# Patient Record
Sex: Female | Born: 1965 | Race: White | Hispanic: No | Marital: Married | Smoking: Former smoker
Health system: Southern US, Community
[De-identification: ages and names within clinical notes are randomized; demographics above are authoritative.]

## PROBLEM LIST (undated history)

## (undated) DIAGNOSIS — I10 Essential (primary) hypertension: Secondary | ICD-10-CM

## (undated) DIAGNOSIS — E079 Disorder of thyroid, unspecified: Secondary | ICD-10-CM

## (undated) HISTORY — PX: HUMERUS FRACTURE SURGERY: SHX670

---

## 2017-04-14 ENCOUNTER — Emergency Department (HOSPITAL_COMMUNITY)
Admission: EM | Admit: 2017-04-14 | Discharge: 2017-04-14 | Disposition: A | Discharge status: Home / Self Care | Payer: Self-pay | Attending: Emergency Medicine | Admitting: Emergency Medicine

## 2017-04-14 ENCOUNTER — Encounter (HOSPITAL_COMMUNITY): Payer: Self-pay | Admitting: *Deleted

## 2017-04-14 ENCOUNTER — Other Ambulatory Visit: Payer: Self-pay

## 2017-04-14 DIAGNOSIS — R51 Headache: Secondary | ICD-10-CM | POA: Insufficient documentation

## 2017-04-14 DIAGNOSIS — Z880 Allergy status to penicillin: Secondary | ICD-10-CM | POA: Insufficient documentation

## 2017-04-14 DIAGNOSIS — I10 Essential (primary) hypertension: Secondary | ICD-10-CM | POA: Insufficient documentation

## 2017-04-14 DIAGNOSIS — F17200 Nicotine dependence, unspecified, uncomplicated: Secondary | ICD-10-CM | POA: Insufficient documentation

## 2017-04-14 HISTORY — DX: Essential (primary) hypertension: I10

## 2017-04-14 HISTORY — DX: Disorder of thyroid, unspecified: E07.9

## 2017-04-14 LAB — I-STAT CHEM 8, ED
BUN: 12 mg/dL (ref 6–20)
CHLORIDE: 100 mmol/L — AB (ref 101–111)
Calcium, Ion: 1.19 mmol/L (ref 1.15–1.40)
Creatinine, Ser: 0.7 mg/dL (ref 0.44–1.00)
Glucose, Bld: 97 mg/dL (ref 65–99)
HEMATOCRIT: 47 % — AB (ref 36.0–46.0)
Hemoglobin: 16 g/dL — ABNORMAL HIGH (ref 12.0–15.0)
Potassium: 4.1 mmol/L (ref 3.5–5.1)
SODIUM: 138 mmol/L (ref 135–145)
TCO2: 27 mmol/L (ref 22–32)

## 2017-04-14 MED ORDER — AMLODIPINE BESYLATE 5 MG PO TABS
10.0000 mg | ORAL_TABLET | Freq: Once | ORAL | Status: AC
  Administered 2017-04-14: 10 mg via ORAL
  Filled 2017-04-14: qty 2

## 2017-04-14 MED ORDER — AMLODIPINE BESYLATE 5 MG PO TABS: 5.0000 mg | ORAL_TABLET | Freq: Every day | ORAL | 0 refills | Status: DC

## 2017-04-14 MED ORDER — ACETAMINOPHEN 500 MG PO TABS: 1000.0000 mg | ORAL_TABLET | Freq: Once | ORAL | Status: DC

## 2017-04-14 NOTE — ED Provider Notes (Signed)
[facility] EMERGENCY DEPARTMENT Provider Note   CSN: [id number] Arrival date & time: 04/14/17  1324     History   Chief Complaint Chief Complaint  Patient presents with  . Hypertension    HPI Melanie Johnson is a 52 y.o. female.  Patient with hx htn, presents w concern high bp. Had been compliant w meds w exception of not taking hctz for past few days. Has adequate of her meds at home. Had gradual onset of a slight headache earlier. No severe headaches. No change in vision or speech. No numbness/weakness. No chest pain or sob. No nv. States otherwise feels well, at baseline.      The history is provided by the patient.  Hypertension  Pertinent negatives include no chest pain, no abdominal pain and no shortness of breath.    Past Medical History:  Diagnosis Date  . Hypertension   . Thyroid disease     There are no active problems to display for this patient.   History reviewed. No pertinent surgical history.  OB History    No data available       Home Medications    Prior to Admission medications   Not on File    Family History History reviewed. No pertinent family history.  Social History Social History   Tobacco Use  . Smoking status: Current Every Day Smoker  Substance Use Topics  . Alcohol use: No    Frequency: Never  . Drug use: No     Allergies   Penicillins   Review of Systems Review of Systems  Constitutional: Negative for fever.  HENT: Negative for sore throat.   Eyes: Negative for visual disturbance.  Respiratory: Negative for shortness of breath.   Cardiovascular: Negative for chest pain.  Gastrointestinal: Negative for abdominal pain.  Genitourinary: Negative for flank pain.  Musculoskeletal: Negative for neck pain.  Skin: Negative for rash.  Neurological: Negative for weakness and numbness.  Psychiatric/Behavioral: Negative for confusion.     Physical Exam Updated Vital Signs BP (!) 219/119 (BP  Location: Right Arm)   Pulse 63   Temp 98.7 F (37.1 C) (Oral)   Resp 18   SpO2 100%   Physical Exam  Constitutional: She appears well-developed and well-nourished. No distress.  HENT:  Head: Atraumatic.  Eyes: Conjunctivae are normal. No scleral icterus.  Neck: Neck supple. No tracheal deviation present. No thyromegaly present.  Cardiovascular: Normal rate, regular rhythm, normal heart sounds and intact distal pulses.  No murmur heard. Pulmonary/Chest: Effort normal and breath sounds normal. No respiratory distress.  Abdominal: Normal appearance. She exhibits no distension.  Musculoskeletal: She exhibits no edema.  Neurological: She is alert.  Speech normal. Steady gait.   Skin: Skin is warm and dry. No rash noted. She is not diaphoretic.  Psychiatric: She has a normal mood and affect.  Nursing note and vitals reviewed.    ED Treatments / Results  Labs (all labs ordered are listed, but only abnormal results are displayed) Results for orders placed or performed during the hospital encounter of 04/14/17  I-stat chem 8, ed  Result Value Ref Range   Sodium 138 135 - 145 mmol/L   Potassium 4.1 3.5 - 5.1 mmol/L   Chloride 100 (L) 101 - 111 mmol/L   BUN 12 6 - 20 mg/dL   Creatinine, Ser 4.01 0.44 - 1.00 mg/dL   Glucose, Bld 97 65 - 99 mg/dL   Calcium, Ion 0.27 2.53 - 1.40 mmol/L   TCO2 27  22 - 32 mmol/L   Hemoglobin 16.0 (H) 12.0 - 15.0 g/dL   HCT 16.147.0 (H) 09.636.0 - 04.546.0 %    EKG  EKG Interpretation None       Radiology No results found.  Procedures Procedures (including critical care time)  Medications Ordered in ED Medications  amLODipine (NORVASC) tablet 10 mg (not administered)     Initial Impression / Assessment and Plan / ED Course  I have reviewed the triage vital signs and the nursing notes.  Pertinent labs & imaging results that were available during my care of the patient were reviewed by me and considered in my medical decision making (see chart for  details).  bp high.   istat 8 sent.   Amlodipine po.  Acetaminophen po.  bp improved.   Labs reviewed - normal.   Final Clinical Impressions(s) / ED Diagnoses   Final diagnoses:  None    ED Discharge Orders    None       Cathren LaineSteinl, William Schake, MD 04/14/17 1010

## 2017-04-14 NOTE — Discharge Instructions (Signed)
It was our pleasure to provide your ER care today - we hope that you feel better.  Take amlodipine as prescribed, in addition to your other blood pressure medication.  Follow up with your doctor for recheck in the coming week.  Return to ER if worse, new symptoms, other concern.

## 2017-04-14 NOTE — ED Triage Notes (Signed)
Pt reports hx of HTN,having headache for several days and SBP > 200. Only complaint is headache. No acute distress is noted at triage. BP 219/119.

## 2018-01-10 ENCOUNTER — Inpatient Hospital Stay (HOSPITAL_COMMUNITY)
Admission: RE | Admit: 2018-01-10 | Discharge: 2018-01-13 | DRG: 247 | Disposition: A | Discharge status: Home / Self Care | Payer: Medicaid Other | Source: Other Acute Inpatient Hospital | Attending: Cardiovascular Disease | Admitting: Cardiovascular Disease

## 2018-01-10 ENCOUNTER — Encounter (HOSPITAL_COMMUNITY)
Admission: RE | Disposition: A | Discharge status: Home / Self Care | Payer: Self-pay | Source: Other Acute Inpatient Hospital | Attending: Cardiovascular Disease

## 2018-01-10 DIAGNOSIS — I2510 Atherosclerotic heart disease of native coronary artery without angina pectoris: Secondary | ICD-10-CM | POA: Diagnosis not present

## 2018-01-10 DIAGNOSIS — I255 Ischemic cardiomyopathy: Secondary | ICD-10-CM | POA: Diagnosis present

## 2018-01-10 DIAGNOSIS — I213 ST elevation (STEMI) myocardial infarction of unspecified site: Secondary | ICD-10-CM

## 2018-01-10 DIAGNOSIS — I2102 ST elevation (STEMI) myocardial infarction involving left anterior descending coronary artery: Secondary | ICD-10-CM | POA: Diagnosis present

## 2018-01-10 DIAGNOSIS — E876 Hypokalemia: Secondary | ICD-10-CM | POA: Diagnosis not present

## 2018-01-10 DIAGNOSIS — Z880 Allergy status to penicillin: Secondary | ICD-10-CM

## 2018-01-10 DIAGNOSIS — R0600 Dyspnea, unspecified: Secondary | ICD-10-CM

## 2018-01-10 DIAGNOSIS — I10 Essential (primary) hypertension: Secondary | ICD-10-CM | POA: Diagnosis present

## 2018-01-10 DIAGNOSIS — E785 Hyperlipidemia, unspecified: Secondary | ICD-10-CM | POA: Diagnosis present

## 2018-01-10 DIAGNOSIS — Z8249 Family history of ischemic heart disease and other diseases of the circulatory system: Secondary | ICD-10-CM

## 2018-01-10 DIAGNOSIS — I2109 ST elevation (STEMI) myocardial infarction involving other coronary artery of anterior wall: Secondary | ICD-10-CM

## 2018-01-10 DIAGNOSIS — F17210 Nicotine dependence, cigarettes, uncomplicated: Secondary | ICD-10-CM | POA: Diagnosis present

## 2018-01-10 DIAGNOSIS — Z955 Presence of coronary angioplasty implant and graft: Secondary | ICD-10-CM

## 2018-01-10 HISTORY — PX: CORONARY/GRAFT ACUTE MI REVASCULARIZATION: CATH118305

## 2018-01-10 HISTORY — PX: LEFT HEART CATH AND CORONARY ANGIOGRAPHY: CATH118249

## 2018-01-10 HISTORY — DX: ST elevation (STEMI) myocardial infarction involving other coronary artery of anterior wall: I21.09

## 2018-01-10 SURGERY — CORONARY/GRAFT ACUTE MI REVASCULARIZATION
Anesthesia: LOCAL

## 2018-01-10 MED ORDER — HEPARIN SODIUM (PORCINE) 1000 UNIT/ML IJ SOLN
INTRAMUSCULAR | Status: DC | PRN
  Administered 2018-01-10: 2000 [IU] via INTRAVENOUS
  Administered 2018-01-10: 10000 [IU] via INTRAVENOUS
  Administered 2018-01-11: 1000 [IU] via INTRAVENOUS

## 2018-01-10 MED ORDER — FENTANYL CITRATE (PF) 100 MCG/2ML IJ SOLN
INTRAMUSCULAR | Status: AC
  Filled 2018-01-10: qty 2

## 2018-01-10 MED ORDER — SODIUM CHLORIDE 0.9 % IV SOLN
INTRAVENOUS | Status: AC | PRN
  Administered 2018-01-10: 50 mL/h via INTRAVENOUS

## 2018-01-10 MED ORDER — TICAGRELOR 90 MG PO TABS
ORAL_TABLET | ORAL | Status: DC | PRN
  Administered 2018-01-10: 180 mg via ORAL

## 2018-01-10 MED ORDER — NITROGLYCERIN 1 MG/10 ML FOR IR/CATH LAB
INTRA_ARTERIAL | Status: DC | PRN
  Administered 2018-01-10 – 2018-01-11 (×2): 150 ug via INTRACORONARY

## 2018-01-10 MED ORDER — NITROGLYCERIN 1 MG/10 ML FOR IR/CATH LAB
INTRA_ARTERIAL | Status: AC
  Filled 2018-01-10: qty 10

## 2018-01-10 MED ORDER — VERAPAMIL HCL 2.5 MG/ML IV SOLN
INTRAVENOUS | Status: DC | PRN
  Administered 2018-01-10: 10 mL via INTRA_ARTERIAL

## 2018-01-10 MED ORDER — HEPARIN (PORCINE) IN NACL 1000-0.9 UT/500ML-% IV SOLN
INTRAVENOUS | Status: DC | PRN
  Administered 2018-01-10 (×2): 500 mL

## 2018-01-10 MED ORDER — TICAGRELOR 90 MG PO TABS
ORAL_TABLET | ORAL | Status: AC
  Filled 2018-01-10: qty 2

## 2018-01-10 MED ORDER — LIDOCAINE HCL (PF) 1 % IJ SOLN
INTRAMUSCULAR | Status: AC
  Filled 2018-01-10: qty 30

## 2018-01-10 MED ORDER — FENTANYL CITRATE (PF) 100 MCG/2ML IJ SOLN
INTRAMUSCULAR | Status: DC | PRN
  Administered 2018-01-10: 25 ug via INTRAVENOUS

## 2018-01-10 MED ORDER — LIDOCAINE HCL (PF) 1 % IJ SOLN
INTRAMUSCULAR | Status: DC | PRN
  Administered 2018-01-10: 2 mL via SUBCUTANEOUS

## 2018-01-10 MED ORDER — MIDAZOLAM HCL 2 MG/2ML IJ SOLN
INTRAMUSCULAR | Status: AC
  Filled 2018-01-10: qty 2

## 2018-01-10 MED ORDER — MIDAZOLAM HCL 2 MG/2ML IJ SOLN
INTRAMUSCULAR | Status: DC | PRN
  Administered 2018-01-10: 2 mg via INTRAVENOUS

## 2018-01-10 SURGICAL SUPPLY — 21 items
BALLN SAPPHIRE 2.0X15 (BALLOONS) ×2
BALLN SAPPHIRE ~~LOC~~ 2.25X12 (BALLOONS) ×2 IMPLANT
BALLOON SAPPHIRE 2.0X15 (BALLOONS) ×1 IMPLANT
CATH INFINITI 5 FR JL3.5 (CATHETERS) ×2 IMPLANT
CATH INFINITI 5FR ANG PIGTAIL (CATHETERS) ×2 IMPLANT
CATH LAUNCHER 5F EBU3.0 (CATHETERS) ×1 IMPLANT
CATHETER LAUNCHER 5F EBU3.0 (CATHETERS) ×2
DEVICE RAD COMP TR BAND LRG (VASCULAR PRODUCTS) ×2 IMPLANT
ELECT DEFIB PAD ADLT CADENCE (PAD) ×2 IMPLANT
GLIDESHEATH SLEND SS 6F .021 (SHEATH) ×2 IMPLANT
GUIDEWIRE INQWIRE 1.5J.035X260 (WIRE) ×1 IMPLANT
INQWIRE 1.5J .035X260CM (WIRE) ×2
KIT ENCORE 26 ADVANTAGE (KITS) ×2 IMPLANT
KIT HEART LEFT (KITS) ×2 IMPLANT
KIT HEMO VALVE WATCHDOG (MISCELLANEOUS) ×2 IMPLANT
PACK CARDIAC CATHETERIZATION (CUSTOM PROCEDURE TRAY) ×2 IMPLANT
STENT RESOLUTE ONYX 2.0X26 (Permanent Stent) ×2 IMPLANT
SYR MEDRAD MARK V 150ML (SYRINGE) ×2 IMPLANT
TRANSDUCER W/STOPCOCK (MISCELLANEOUS) ×2 IMPLANT
TUBING CIL FLEX 10 FLL-RA (TUBING) ×2 IMPLANT
WIRE COUGAR XT STRL 190CM (WIRE) ×2 IMPLANT

## 2018-01-11 ENCOUNTER — Other Ambulatory Visit: Payer: Self-pay

## 2018-01-11 ENCOUNTER — Encounter (HOSPITAL_COMMUNITY): Payer: Self-pay | Admitting: Cardiovascular Disease

## 2018-01-11 ENCOUNTER — Inpatient Hospital Stay (HOSPITAL_COMMUNITY): Payer: Medicaid Other

## 2018-01-11 DIAGNOSIS — I213 ST elevation (STEMI) myocardial infarction of unspecified site: Secondary | ICD-10-CM | POA: Diagnosis not present

## 2018-01-11 DIAGNOSIS — Z880 Allergy status to penicillin: Secondary | ICD-10-CM | POA: Diagnosis not present

## 2018-01-11 DIAGNOSIS — I2109 ST elevation (STEMI) myocardial infarction involving other coronary artery of anterior wall: Secondary | ICD-10-CM | POA: Diagnosis present

## 2018-01-11 DIAGNOSIS — I2510 Atherosclerotic heart disease of native coronary artery without angina pectoris: Secondary | ICD-10-CM | POA: Diagnosis present

## 2018-01-11 DIAGNOSIS — E876 Hypokalemia: Secondary | ICD-10-CM | POA: Diagnosis not present

## 2018-01-11 DIAGNOSIS — F17210 Nicotine dependence, cigarettes, uncomplicated: Secondary | ICD-10-CM | POA: Diagnosis present

## 2018-01-11 DIAGNOSIS — R079 Chest pain, unspecified: Secondary | ICD-10-CM | POA: Diagnosis present

## 2018-01-11 DIAGNOSIS — I10 Essential (primary) hypertension: Secondary | ICD-10-CM | POA: Diagnosis present

## 2018-01-11 DIAGNOSIS — I255 Ischemic cardiomyopathy: Secondary | ICD-10-CM | POA: Diagnosis present

## 2018-01-11 DIAGNOSIS — E785 Hyperlipidemia, unspecified: Secondary | ICD-10-CM | POA: Diagnosis present

## 2018-01-11 DIAGNOSIS — I2102 ST elevation (STEMI) myocardial infarction involving left anterior descending coronary artery: Secondary | ICD-10-CM | POA: Diagnosis present

## 2018-01-11 DIAGNOSIS — Z8249 Family history of ischemic heart disease and other diseases of the circulatory system: Secondary | ICD-10-CM | POA: Diagnosis not present

## 2018-01-11 HISTORY — DX: ST elevation (STEMI) myocardial infarction involving left anterior descending coronary artery: I21.02

## 2018-01-11 LAB — LIPID PANEL
Cholesterol: 207 mg/dL — ABNORMAL HIGH (ref 0–200)
HDL: 45 mg/dL (ref >40)
LDL Cholesterol: 143 mg/dL — ABNORMAL HIGH (ref 0–99)
Total CHOL/HDL Ratio: 4.6 RATIO
Triglycerides: 97 mg/dL (ref <150)
VLDL: 19 mg/dL (ref 0–40)

## 2018-01-11 LAB — CBC
HCT: 43.5 % (ref 36.0–46.0)
HCT: 43.9 % (ref 36.0–46.0)
Hemoglobin: 14.6 g/dL (ref 12.0–15.0)
Hemoglobin: 14.7 g/dL (ref 12.0–15.0)
MCH: 29.1 pg (ref 26.0–34.0)
MCH: 29.6 pg (ref 26.0–34.0)
MCHC: 33.3 g/dL (ref 30.0–36.0)
MCHC: 33.8 g/dL (ref 30.0–36.0)
MCV: 87.5 fL (ref 80.0–100.0)
MCV: 87.7 fL (ref 80.0–100.0)
NRBC: 0 % (ref 0.0–0.2)
PLATELETS: 209 10*3/uL (ref 150–400)
PLATELETS: 225 10*3/uL (ref 150–400)
RBC: 4.96 MIL/uL (ref 3.87–5.11)
RBC: 5.02 MIL/uL (ref 3.87–5.11)
RDW: 11.9 % (ref 11.5–15.5)
RDW: 11.9 % (ref 11.5–15.5)
WBC: 10.7 10*3/uL — ABNORMAL HIGH (ref 4.0–10.5)
WBC: 10.7 10*3/uL — ABNORMAL HIGH (ref 4.0–10.5)
nRBC: 0 % (ref 0.0–0.2)

## 2018-01-11 LAB — BASIC METABOLIC PANEL
ANION GAP: 7 (ref 5–15)
ANION GAP: 10 (ref 5–15)
Anion gap: 7 (ref 5–15)
BUN: 12 mg/dL (ref 6–20)
BUN: 7 mg/dL (ref 6–20)
BUN: 10 mg/dL (ref 6–20)
CALCIUM: 9 mg/dL (ref 8.9–10.3)
CHLORIDE: 100 mmol/L (ref 98–111)
CHLORIDE: 104 mmol/L (ref 98–111)
CO2: 27 mmol/L (ref 22–32)
CO2: 23 mmol/L (ref 22–32)
CO2: 21 mmol/L — AB (ref 22–32)
CREATININE: 0.76 mg/dL (ref 0.44–1.00)
CREATININE: 0.71 mg/dL (ref 0.44–1.00)
CREATININE: 0.69 mg/dL (ref 0.44–1.00)
Calcium: 8.9 mg/dL (ref 8.9–10.3)
Calcium: 8.9 mg/dL (ref 8.9–10.3)
Chloride: 106 mmol/L (ref 98–111)
GFR calc Af Amer: >60 mL/min (ref >60)
GFR calc non Af Amer: >60 mL/min (ref >60)
GFR calc non Af Amer: >60 mL/min (ref >60)
GFR calc non Af Amer: >60 mL/min (ref >60)
GLUCOSE: 117 mg/dL — AB (ref 70–99)
Glucose, Bld: 106 mg/dL — ABNORMAL HIGH (ref 70–99)
Glucose, Bld: 132 mg/dL — ABNORMAL HIGH (ref 70–99)
POTASSIUM: 2.9 mmol/L — AB (ref 3.5–5.1)
Potassium: 3.5 mmol/L (ref 3.5–5.1)
Potassium: 2.9 mmol/L — ABNORMAL LOW (ref 3.5–5.1)
Sodium: 134 mmol/L — ABNORMAL LOW (ref 135–145)
Sodium: 136 mmol/L (ref 135–145)
Sodium: 135 mmol/L (ref 135–145)

## 2018-01-11 LAB — BRAIN NATRIURETIC PEPTIDE: B NATRIURETIC PEPTIDE 5: 74.6 pg/mL (ref 0.0–100.0)

## 2018-01-11 LAB — HIV ANTIBODY (ROUTINE TESTING W REFLEX): HIV Screen 4th Generation wRfx: NONREACTIVE

## 2018-01-11 LAB — MRSA PCR SCREENING: MRSA BY PCR: NEGATIVE

## 2018-01-11 LAB — ECHOCARDIOGRAM COMPLETE
Height: 58 in
WEIGHTICAEL: 2017.65 [oz_av]

## 2018-01-11 LAB — POCT I-STAT, CHEM 8
BUN: 18 mg/dL (ref 6–20)
CREATININE: 0.9 mg/dL (ref 0.44–1.00)
Calcium, Ion: 1.18 mmol/L (ref 1.15–1.40)
Chloride: 95 mmol/L — ABNORMAL LOW (ref 98–111)
GLUCOSE: 122 mg/dL — AB (ref 70–99)
HCT: 43 % (ref 36.0–46.0)
HEMOGLOBIN: 14.6 g/dL (ref 12.0–15.0)
POTASSIUM: 2.9 mmol/L — AB (ref 3.5–5.1)
Sodium: 135 mmol/L (ref 135–145)
TCO2: 27 mmol/L (ref 22–32)

## 2018-01-11 LAB — TROPONIN I
TROPONIN I: 33.64 ng/mL — AB (ref <0.03)
Troponin I: 37.39 ng/mL (ref <0.03)
Troponin I: 21.22 ng/mL (ref <0.03)

## 2018-01-11 LAB — MAGNESIUM: Magnesium: 1.9 mg/dL (ref 1.7–2.4)

## 2018-01-11 LAB — TSH: TSH: 3.607 u[IU]/mL (ref 0.350–4.500)

## 2018-01-11 LAB — POCT ACTIVATED CLOTTING TIME
Activated Clotting Time: 257 seconds
Activated Clotting Time: 274 seconds

## 2018-01-11 MED ORDER — HYDRALAZINE HCL 20 MG/ML IJ SOLN: 5.0000 mg | INTRAMUSCULAR | Status: AC | PRN

## 2018-01-11 MED ORDER — TICAGRELOR 90 MG PO TABS
90.0000 mg | ORAL_TABLET | Freq: Two times a day (BID) | ORAL | Status: DC
  Administered 2018-01-11 – 2018-01-13 (×5): 90 mg via ORAL
  Filled 2018-01-11 (×5): qty 1

## 2018-01-11 MED ORDER — HEPARIN SODIUM (PORCINE) 5000 UNIT/ML IJ SOLN
5000.0000 [IU] | Freq: Three times a day (TID) | INTRAMUSCULAR | Status: DC
  Administered 2018-01-11 – 2018-01-13 (×5): 5000 [IU] via SUBCUTANEOUS
  Filled 2018-01-11 (×5): qty 1

## 2018-01-11 MED ORDER — LISINOPRIL 5 MG PO TABS
5.0000 mg | ORAL_TABLET | Freq: Every day | ORAL | Status: DC
  Administered 2018-01-11 – 2018-01-13 (×3): 5 mg via ORAL
  Filled 2018-01-11 (×3): qty 1

## 2018-01-11 MED ORDER — POTASSIUM CHLORIDE CRYS ER 20 MEQ PO TBCR
40.0000 meq | EXTENDED_RELEASE_TABLET | Freq: Once | ORAL | Status: AC
  Administered 2018-01-12: 40 meq via ORAL
  Filled 2018-01-11: qty 2

## 2018-01-11 MED ORDER — LABETALOL HCL 5 MG/ML IV SOLN: 10.0000 mg | INTRAVENOUS | Status: AC | PRN

## 2018-01-11 MED ORDER — CARVEDILOL 3.125 MG PO TABS
3.1250 mg | ORAL_TABLET | Freq: Two times a day (BID) | ORAL | Status: DC
  Administered 2018-01-11 – 2018-01-12 (×3): 3.125 mg via ORAL
  Filled 2018-01-11 (×3): qty 1

## 2018-01-11 MED ORDER — LISINOPRIL 10 MG PO TABS: 10.0000 mg | ORAL_TABLET | Freq: Every day | ORAL | Status: DC

## 2018-01-11 MED ORDER — ONDANSETRON HCL 4 MG/2ML IJ SOLN: 4.0000 mg | Freq: Four times a day (QID) | INTRAMUSCULAR | Status: DC | PRN

## 2018-01-11 MED ORDER — NITROGLYCERIN 0.4 MG SL SUBL: 0.4000 mg | SUBLINGUAL_TABLET | SUBLINGUAL | Status: DC | PRN

## 2018-01-11 MED ORDER — POTASSIUM CHLORIDE CRYS ER 20 MEQ PO TBCR
40.0000 meq | EXTENDED_RELEASE_TABLET | Freq: Once | ORAL | Status: AC
  Administered 2018-01-12: 40 meq via ORAL

## 2018-01-11 MED ORDER — SODIUM CHLORIDE 0.9 % IV SOLN: 250.0000 mL | INTRAVENOUS | Status: DC | PRN

## 2018-01-11 MED ORDER — SODIUM CHLORIDE 0.9% FLUSH
3.0000 mL | Freq: Two times a day (BID) | INTRAVENOUS | Status: DC
  Administered 2018-01-11 – 2018-01-13 (×4): 3 mL via INTRAVENOUS

## 2018-01-11 MED ORDER — PERFLUTREN LIPID MICROSPHERE
4.0000 mL | Freq: Once | INTRAVENOUS | Status: AC
  Administered 2018-01-11: 4 mL via INTRAVENOUS
  Filled 2018-01-11: qty 4

## 2018-01-11 MED ORDER — ASPIRIN EC 81 MG PO TBEC
81.0000 mg | DELAYED_RELEASE_TABLET | Freq: Every day | ORAL | Status: DC
  Administered 2018-01-11 – 2018-01-13 (×3): 81 mg via ORAL
  Filled 2018-01-11 (×3): qty 1

## 2018-01-11 MED ORDER — SODIUM CHLORIDE 0.9% FLUSH
3.0000 mL | INTRAVENOUS | Status: DC | PRN
  Administered 2018-01-12: 3 mL via INTRAVENOUS
  Filled 2018-01-11: qty 3

## 2018-01-11 MED ORDER — PERFLUTREN LIPID MICROSPHERE
INTRAVENOUS | Status: AC
  Administered 2018-01-11: 4 mL via INTRAVENOUS
  Filled 2018-01-11: qty 10

## 2018-01-11 MED ORDER — ACETAMINOPHEN 325 MG PO TABS: 650.0000 mg | ORAL_TABLET | ORAL | Status: DC | PRN

## 2018-01-11 MED ORDER — IOHEXOL 350 MG/ML SOLN
INTRAVENOUS | Status: DC | PRN
  Administered 2018-01-11: 145 mL via INTRA_ARTERIAL

## 2018-01-11 MED ORDER — POTASSIUM CHLORIDE CRYS ER 20 MEQ PO TBCR
40.0000 meq | EXTENDED_RELEASE_TABLET | Freq: Once | ORAL | Status: AC
  Administered 2018-01-11: 40 meq via ORAL
  Filled 2018-01-11: qty 2

## 2018-01-11 MED ORDER — ATORVASTATIN CALCIUM 80 MG PO TABS
80.0000 mg | ORAL_TABLET | Freq: Every day | ORAL | Status: DC
  Administered 2018-01-11 – 2018-01-12 (×2): 80 mg via ORAL
  Filled 2018-01-11 (×2): qty 1

## 2018-01-11 MED ORDER — OXYCODONE HCL 5 MG PO TABS: 5.0000 mg | ORAL_TABLET | ORAL | Status: DC | PRN

## 2018-01-11 MED ORDER — SODIUM CHLORIDE 0.9 % WEIGHT BASED INFUSION
1.0000 mL/kg/h | INTRAVENOUS | Status: AC
  Administered 2018-01-11: 1 mL/kg/h via INTRAVENOUS

## 2018-01-11 MED ORDER — VERAPAMIL HCL 2.5 MG/ML IV SOLN
INTRAVENOUS | Status: DC | PRN
  Administered 2018-01-11 (×4): 200 ug via INTRACORONARY

## 2018-01-11 NOTE — Progress Notes (Signed)
AO4, denies pain. Right radial dressing cdi, level 0. Room air. NSR with rare pac/pvc. SBP 120's/80's. Heart healthy diet. Saline locked. Up ad lib, steady gait. + void without issue. K replaced, re draw entered for 1400. Family @ bedside.

## 2018-01-11 NOTE — H&P (Signed)
Cardiology Admission History and Physical:   Patient ID: Melanie Johnson MRN: [id number]; DOB: 10/02/1965   Admission date: 01/10/2018  Primary Care Provider: Alinda Deem, MD Primary Cardiologist: No primary care provider on file.  Primary Electrophysiologist:  None   Chief Complaint:  Chest Pain  Patient Profile:   Melanie Johnson is a 52 y.o. female with a history of tobacco abuse and HTN, presenting in transfer from [facility] with an anterior STEMI  History of Present Illness:   Melanie Johnson awoke this morning with chest and arm discomfort that she describes as a pressure and aching sensation.  The pain persisted much of the day and she ultimately presented to [facility] where an EKG demonstrated acute anterior injury.  A code STEMI is called and she is transferred emergently for cardiac catheterization and possible PCI.  The patient has no past history of cardiac disease.  She is a 2 pack/day smoker.  She has had issues with uncontrolled hypertension, but has been compliant with her medications and reports that her blood pressure has been reasonably well controlled until tonight's presentation when her blood pressure was greater than 200/100 mmHg.  She currently denies chest pain or shortness of breath.  She has nausea.  Denies diaphoresis, lightheadedness, or syncope.  She has had episodes of chest pain over the past few weeks but no sustained symptoms until today's event.  She states her pain resolved after receiving morphine in route.   Past Medical History:  Diagnosis Date  . Hypertension   . Thyroid disease     No past surgical history on file.   Medications Prior to Admission: Prior to Admission medications   Medication Sig Start Date End Date Taking? Authorizing Provider  amLODipine (NORVASC) 5 MG tablet Take 1 tablet (5 mg total) by mouth daily. 04/14/17   Cathren Laine, MD     Allergies:    Allergies  Allergen Reactions  . Penicillins     Social  History:   Social History   Socioeconomic History  . Marital status: Married    Spouse name: Not on file  . Number of children: Not on file  . Years of education: Not on file  . Highest education level: Not on file  Occupational History  . Not on file  Social Needs  . Financial resource strain: Not on file  . Food insecurity:    Worry: Not on file    Inability: Not on file  . Transportation needs:    Medical: Not on file    Non-medical: Not on file  Tobacco Use  . Smoking status: Current Every Day Smoker  Substance and Sexual Activity  . Alcohol use: No    Frequency: Never  . Drug use: No  . Sexual activity: Not on file  Lifestyle  . Physical activity:    Days per week: Not on file    Minutes per session: Not on file  . Stress: Not on file  Relationships  . Social connections:    Talks on phone: Not on file    Gets together: Not on file    Attends religious service: Not on file    Active member of club or organization: Not on file    Attends meetings of clubs or organizations: Not on file    Relationship status: Not on file  . Intimate partner violence:    Fear of current or ex partner: Not on file    Emotionally abused: Not on file    Physically abused:  Not on file    Forced sexual activity: Not on file  Other Topics Concern  . Not on file  Social History Narrative  . Not on file    Family History:   The patient's family history includes CAD in her father; Heart attack in her father; Heart disease in her father.    ROS:  Please see the history of present illness.  All other ROS reviewed and negative.     Physical Exam/Data:   Vitals:   01/10/18 2334 01/11/18 0043  Pulse:  71  Resp:  19  SpO2: (!) 2% 92%  Weight:  57.2 kg  Height:  4\' 10"  (1.473 m)   No intake or output data in the 24 hours ending 01/11/18 0058 Filed Weights   01/11/18 0043  Weight: 57.2 kg   Body mass index is 26.36 kg/m.  General:  Well nourished, well developed, in no acute  distress HEENT: normal Lymph: no adenopathy Neck: no JVD Endocrine:  No thryomegaly Vascular: No carotid bruits; FA pulses 2+ bilaterally  Cardiac:  normal S1, S2; RRR; no murmur  Lungs:  clear to auscultation bilaterally, no wheezing, rhonchi or rales  Abd: soft, nontender, no hepatomegaly  Ext: no edema Musculoskeletal:  No deformities, BUE and BLE strength normal and equal Skin: warm and dry  Neuro:  CNs 2-12 intact, no focal abnormalities noted Psych:  Normal affect    EKG:  The ECG that was done was personally reviewed and demonstrates normal sinus rhythm with acute anterior injury  Relevant CV Studies: Pending  Laboratory Data:  ChemistryNo results for input(s): NA, K, CL, CO2, GLUCOSE, BUN, CREATININE, CALCIUM, GFRNONAA, GFRAA, ANIONGAP in the last 168 hours.  No results for input(s): PROT, ALBUMIN, AST, ALT, ALKPHOS, BILITOT in the last 168 hours. HematologyNo results for input(s): WBC, RBC, HGB, HCT, MCV, MCH, MCHC, RDW, PLT in the last 168 hours. Cardiac EnzymesNo results for input(s): TROPONINI in the last 168 hours. No results for input(s): TROPIPOC in the last 168 hours.  BNPNo results for input(s): BNP, PROBNP in the last 168 hours.  DDimer No results for input(s): DDIMER in the last 168 hours.  Radiology/Studies:  No results found.  Assessment and Plan:   1. Acute anterior STEMI: Plan emergency cardiac catheterization and primary PCI.  Emergency implied consent is obtained.  Further plan/disposition pending her cardiac catheterization result.  She has received aspirin and heparin.  Anticipate starting her on ticagrelor if her anatomy is approachable with PCI.  Secondary risk reduction measures will be instituted including initiation of a beta-blocker, ACE or ARB, and high intensity statin drug.  We will counsel her regarding tobacco cessation.  Severity of Illness: The appropriate patient status for this patient is INPATIENT. Inpatient status is judged to be  reasonable and necessary in order to provide the required intensity of service to ensure the patient's safety. The patient's presenting symptoms, physical exam findings, and initial radiographic and laboratory data in the context of their chronic comorbidities is felt to place them at high risk for further clinical deterioration. Furthermore, it is not anticipated that the patient will be medically stable for discharge from the hospital within 2 midnights of admission. The following factors support the patient status of inpatient.   " The patient's presenting symptoms include chest pain. " The initial radiographic and laboratory data are worrisome because of elevated troponin. " The chronic co-morbidities include tobacco, HTN.   * I certify that at the point of admission it is my clinical  judgment that the patient will require inpatient hospital care spanning beyond 2 midnights from the point of admission due to high intensity of service, high risk for further deterioration and high frequency of surveillance required.*    For questions or updates, please contact [facility] Please consult [email or web address] for contact info under        Signed, Tonny BollmanMichael Elgin Carn, MD  01/11/2018 12:58 AM

## 2018-01-11 NOTE — Progress Notes (Signed)
  Echocardiogram 2D Echocardiogram has been performed.  Melanie ChimesWendy  [name] 01/11/2018, 12:26 PM

## 2018-01-11 NOTE — Progress Notes (Signed)
The patient had 11 beats of wide QRS beats. Asymptomatic. Notified [place] with cardiology on call. I will continue to monitor the patient closely.  Sheppard Evensina Cressida Milford RN

## 2018-01-11 NOTE — Progress Notes (Signed)
Report to 6E RN 

## 2018-01-11 NOTE — Progress Notes (Signed)
CARDIAC REHAB PHASE I   PRE:  Rate/Rhythm: 66 SR  BP:  Sitting: 133/82      SaO2: 96 RA  MODE:  Ambulation: 450 ft 81 peak HR  POST:  Rate/Rhythm: 64 SR  BP:  Sitting: 130/87    SaO2: 96 RA   Pt ambulated 41950ft in hallway standby assist with steady gait. Pt denies CP or SOB. Began MI ed with pt. Pt educated on importance of ASA, Brilinta, statin, and NTG. Pt given stent card and MI book. Pt leaving for CXR, will continue to follow.  [id number] Reynold Boweneresa  Damaria Stofko, RN BSN 01/11/2018 2:46 PM

## 2018-01-11 NOTE — Progress Notes (Signed)
K+ low will replace and recheck in AM.  Done for NSVT

## 2018-01-11 NOTE — Progress Notes (Addendum)
Progress Note  Patient Name: Melanie Johnson Date of Encounter: 01/11/2018  Primary Cardiologist: No primary care provider on file.   Subjective   Patient was seen resting in her bed this morning with her daughter and sister at bedside. The patient states that most of her symptoms were upper extremity pain and left shoulder pain. The patient mentioned that she understood that she had a heart attack and will have to make some lifestyle modifications. She requested for assistance with brillinta.   Inpatient Medications    Scheduled Meds: . aspirin EC  81 mg Oral Daily  . atorvastatin  80 mg Oral q1800  . carvedilol  3.125 mg Oral BID WC  . heparin  5,000 Units Subcutaneous Q8H  . sodium chloride flush  3 mL Intravenous Q12H  . ticagrelor  90 mg Oral BID   Continuous Infusions: . sodium chloride    . sodium chloride 1 mL/kg/hr (01/11/18 0131)   PRN Meds: sodium chloride, acetaminophen, nitroGLYCERIN, ondansetron (ZOFRAN) IV, oxyCODONE, sodium chloride flush   Vital Signs    Vitals:   01/11/18 0530 01/11/18 0600 01/11/18 0630 01/11/18 0700  BP: 98/63 113/82 127/88   Pulse: (!) 59 60 63 66  Resp: 18 18 13 19   Temp:      TempSrc:      SpO2: 96% 96% 98% 100%  Weight:      Height:        Intake/Output Summary (Last 24 hours) at 01/11/2018 0753 Last data filed at 01/11/2018 0600 Gross per 24 hour  Intake 256.45 ml  Output 800 ml  Net -543.55 ml   Filed Weights   01/11/18 0043  Weight: 57.2 kg    Telemetry    Normal sinus rhytym - Personally Reviewed  ECG    Normal sinus rhythm with no significant st or t wave abnormalities Personally Reviewed  Physical Exam   Physical Exam  Constitutional: She appears well-developed and well-nourished. No distress.  HENT:  Head: Normocephalic and atraumatic.  Eyes: Conjunctivae are normal.  Cardiovascular: Regular rhythm and normal heart sounds.  Respiratory: Effort normal and breath sounds normal. No respiratory  distress. She has no wheezes.  GI: Soft. Bowel sounds are normal. There is no tenderness.  Musculoskeletal: She exhibits no edema.  Neurological: She is alert.  Skin: She is not diaphoretic. No erythema.  Psychiatric: She has a normal mood and affect. Her behavior is normal. Judgment and thought content normal.   Labs    Chemistry Recent Labs  Lab 01/11/18 0342  NA 134*  K 2.9*  CL 100  CO2 27  GLUCOSE 106*  BUN 12  CREATININE 0.76  CALCIUM 8.9  GFRNONAA >60  GFRAA >60  ANIONGAP 7     Hematology Recent Labs  Lab 01/11/18 0342  WBC 10.7*  10.7*  RBC 5.02  4.96  HGB 14.6  14.7  HCT 43.9  43.5  MCV 87.5  87.7  MCH 29.1  29.6  MCHC 33.3  33.8  RDW 11.9  11.9  PLT 225  209    Cardiac Enzymes Recent Labs  Lab 01/11/18 0342  TROPONINI 33.64*   No results for input(s): TROPIPOC in the last 168 hours.   BNP Recent Labs  Lab 01/11/18 0342  BNP 74.6     DDimer No results for input(s): DDIMER in the last 168 hours.   Radiology    No results found.  Cardiac Studies   Cardiac Cath 01/10/18:  Mid RCA lesion is 30% stenosed.  Suezanne Jacquet  RPDA to RPDA lesion is 40% stenosed.  Ost 1st Mrg to 1st Mrg lesion is 50% stenosed.  Mid Cx to Dist Cx lesion is 50% stenosed.  Prox LAD lesion is 40% stenosed.  Mid LAD lesion is 100% stenosed.  A drug-eluting stent was successfully placed using a STENT RESOLUTE ONYX 2.0X26.  Post intervention, there is a 0% residual stenosis.  There is moderate to severe left ventricular systolic dysfunction.  LV end diastolic pressure is normal.   1.  Total occlusion of the mid LAD, treated successfully with primary PCI using a 2.0 x 26 mm resolute Onyx DES 2.  Patent left main without significant stenosis 3.  Mild to moderate nonobstructive stenosis of the left circumflex 4.  Mild nonobstructive stenosis of the RCA 5.  Moderately severe segmental LV systolic dysfunction consistent with acute anterior MI, LVEF estimated at  35 to 40%, normal LVEDP  Recommend: Post MI medical therapy.  Check echo to further assess extent of LV dysfunction  Recommend uninterrupted dual antiplatelet therapy with Aspirin 81mg  daily and Ticagrelor 90mg  twice daily for a minimum of 12 months (ACS - Class I recommendation).  TTE: Pending   Patient Profile     52 y.o. female with tobacco abuse, htn who [facility] due to chest and arm discomfort. EKG noted anterior STEMI. Troponin elevated to 33. Patient emergently cathed with pci and stent placement in LAD.   Assessment & Plan   Anterior wall STEMI:  Patient found to have anterior wall injury noted on ekg. Code stemi was called and [facility]. Patient found to have total occlusion of mid lad for which she received pci and a onyx des.   EKG this morning 11/12 post pci showed normal sinus rhythm without significant st or t wave abnormalities.   -TTE pending  -DAPT for 12 months  -Atorvastatin 80mg  qd  -Fasting lipid panel: tcholes=207, trig=97, hdl=45, ldl=143 -Continue brilinta -Continue carvedilol 3.125mg  bid -TSH=3.607 -Added lisinopril 5mg  qd  -ordered chest xray as patient dyspneic  Hypertension  Patient presented with blood pressure 200/100. The patient's blood pressure has been ranging 90-120/60-80s over the past 24 hrs.   -continue IV labetalol 10mg  q3610min prn  -continue IV hydralazine 5mg  q7920min prn   Tobacco use:  Patient is a 2ppd smoker for the past 25 years. Smoking cessation counseling provided and encouraged the patient to work with pcp on assistive medication.   For questions or updates, please contact [facility] Please consult [email or web address] for contact info under        Signed, Lorenso CourierVahini Demba Nigh, MD  01/11/2018, 7:53 AM

## 2018-01-11 NOTE — Care Management Note (Signed)
Case Management Note  Patient Details  Name: Melanie Johnson MRN: [id number] Date of Birth: 12-20-1965  Subjective/Objective: 52yo female presented with CP; s/p cath with PCI.                  Action/Plan: CM met with patient/spouse to discuss transitional needs. Patient lived at home with spouse, independent with ADLs PTA. PCP verified as: Dr. Greig Right; pharmacy of choice: [facility], [place]; indicated no health insurance. Brilinta 30-day free card provided and the [employer] (Brilinta) Assistance Application; MD/PA please sign the provider section of the application, located on patient's shadow chart, and return to patient prior to transitioning home. Patient requesting her Rx be filled utilizing [place] service. Patient's family will provide transportation home. CM team will continue to follow.   Expected Discharge Date:  01/13/18               Expected Discharge Plan:  Home/Self Care  In-House Referral:  NA  Discharge planning Services  CM Consult, Medication Assistance(Brilinta 30-day free card)  Post Acute Care Choice:  NA Choice offered to:  NA  DME Arranged:  N/A DME Agency:  NA  HH Arranged:  NA HH Agency:  NA  Status of Service:  In process, will continue to follow  If discussed at Long Length of Stay Meetings, dates discussed:    Additional Comments:  Midge Minium RN, BSN, NCM-BC, ACM-RN [phone] 01/11/2018, 11:10 AM

## 2018-01-12 LAB — BASIC METABOLIC PANEL
ANION GAP: 4 — AB (ref 5–15)
BUN: 8 mg/dL (ref 6–20)
CALCIUM: 9 mg/dL (ref 8.9–10.3)
CHLORIDE: 109 mmol/L (ref 98–111)
CO2: 24 mmol/L (ref 22–32)
Creatinine, Ser: 0.72 mg/dL (ref 0.44–1.00)
GFR calc Af Amer: >60 mL/min (ref >60)
GFR calc non Af Amer: >60 mL/min (ref >60)
GLUCOSE: 85 mg/dL (ref 70–99)
POTASSIUM: 4.5 mmol/L (ref 3.5–5.1)
Sodium: 137 mmol/L (ref 135–145)

## 2018-01-12 MED ORDER — CARVEDILOL 6.25 MG PO TABS
6.2500 mg | ORAL_TABLET | Freq: Two times a day (BID) | ORAL | Status: DC
  Administered 2018-01-12 – 2018-01-13 (×2): 6.25 mg via ORAL
  Filled 2018-01-12 (×2): qty 1

## 2018-01-12 NOTE — Discharge Summary (Addendum)
Discharge Summary    Patient ID: Melanie Johnson MRN: [id number]; DOB: Jul 18, 1965  Admit date: 01/10/2018 Discharge date: 01/13/2018  Primary Care Provider: No primary care provider on file.  Primary Cardiologist: Norman Herrlich, MD   Discharge Diagnoses    Active Problems:   Acute ST elevation myocardial infarction (STEMI) of anterior wall (HCC)   ST elevation myocardial infarction involving left anterior descending (LAD) coronary artery (HCC)  Allergies Allergies  Allergen Reactions  . Penicillins     Childhood allergy unsure of reaction    Diagnostic Studies/Procedures    Cardiac catheterization 01/10/18:   Mid RCA lesion is 30% stenosed.  Ost RPDA to RPDA lesion is 40% stenosed.  Ost 1st Mrg to 1st Mrg lesion is 50% stenosed.  Mid Cx to Dist Cx lesion is 50% stenosed.  Prox LAD lesion is 40% stenosed.  Mid LAD lesion is 100% stenosed.  A drug-eluting stent was successfully placed using a STENT RESOLUTE ONYX 2.0X26.  Post intervention, there is a 0% residual stenosis.  There is moderate to severe left ventricular systolic dysfunction.  LV end diastolic pressure is normal.   1.  Total occlusion of the mid LAD, treated successfully with primary PCI using a 2.0 x 26 mm resolute Onyx DES 2.  Patent left main without significant stenosis 3.  Mild to moderate nonobstructive stenosis of the left circumflex 4.  Mild nonobstructive stenosis of the RCA 5.  Moderately severe segmental LV systolic dysfunction consistent with acute anterior MI, LVEF estimated at 35 to 40%, normal LVEDP  Recommend: Post MI medical therapy.  Check echo to further assess extent of LV dysfunction  Recommend uninterrupted dual antiplatelet therapy with Aspirin 81mg  daily and Ticagrelor 90mg  twice daily for a minimum of 12 months (ACS - Class I recommendation).  Echocardiogram 01/11/18: Study Conclusions  - Left ventricle: The cavity size was normal. Wall thickness was   normal. Systolic  function was moderately reduced. The estimated   ejection fraction was in the range of 35% to 40%. Dyskinesis of   the mid-apicalanteroseptal, anterior, and apical myocardium;   consistent with ischemia or infarction in the distribution of the   left anterior descending coronary artery. Akinesis and scarring   in a limited area of the basal inferolateral myocardium;   consistent with infarction in the distribution of the right   coronary or left circumflex coronary artery. Features are   consistent with a pseudonormal left ventricular filling pattern,   with concomitant abnormal relaxation and increased filling   pressure (grade 2 diastolic dysfunction). No evidence of   thrombus. Acoustic contrast opacification revealed no evidence   ofthrombus.  History of Present Illness     Melanie Johnson is a 52 y.o. female with a history of tobacco abuse and HTN, presenting in transfer from [facility] with an anterior STEMI  Melanie Johnson awoke on the morning of admission with chest and arm discomfort that she describes as a pressure and aching sensation. The pain persisted much of the day and she ultimately presented to [facility] where an EKG demonstrated acute anterior injury. A code STEMI is called and she is transferred emergently for cardiac catheterization and possible PCI.  The patient has no past history of cardiac disease.  She is a 2 pack/day smoker. She has had issues with uncontrolled hypertension, but has been compliant with her medications and reports that her blood pressure has been reasonably well controlled until tonight's presentation when her blood pressure was greater than 200/100 mmHg. [facility]  [facility], she denied chest pain or shortness of breath however had some nausea. She denied diaphoresis, lightheadedness, or syncope. She had some episodes of chest pain over the past few weeks but no sustained symptoms until day of presentation.  In route she was given morphine  with resolution.   Hospital Course     Cardiac cath revealed total occlusion of the mid LAD, treated successfully with primary PCI. There was mild to moderate nonobstructive stenosis of the left circumflex, mild nonobstructive stenosis of the RCA, moderately severe segmental LV systolic dysfunction consistent with acute anterior MI and an LVEF estimated at 35 to 40% with normal LVED. Recommendations were made for uninterrupted dual antiplatelet therapy with Aspirin 81mg  daily and Ticagrelor 90mg  twice daily for a minimum of 12 months. Troponin peak at 37.39   Unfortunately, her post cath echocardiogram revealed an EF of 35% to 40% with dyskinesis of the mid-apicalanteroseptal, anterior, and apical myocardium, consistent with ischemia or infarction in the distribution of the left anterior descending coronary artery. There is akinesis and scarring in a limited area of the basal inferolateral myocardium, consistent with infarction in the distribution of the right coronary or left circumflex coronary artery. There is G2DD.   She has done well post-cath. Right radial site free of hematoma. She has not had recurrent chest pain. She has ambulated with cardiac rehabilitation without complication. She has no signs of fluid volume overload related to LV dysfunction. She had mild complaints of SOB on 01/11/18 which has now resolved. May need to monitor for SOB related to Brilinta initiation. LDL was noted to be 143 on admission.  Consultants: None   General: Well developed, well nourished, NAD Skin: Warm, dry, intact  Head: Normocephalic, atraumatic, clear, moist mucus membranes. Neck: Negative for carotid bruits. No JVD Lungs:Clear to ausculation bilaterally. No wheezes, rales, or rhonchi. Breathing is unlabored. Cardiovascular: RRR with S1 S2. No murmurs, rubs, gallops, or LV heave appreciated. Abdomen: Soft, non-tender, non-distended with normoactive bowel sounds. No obvious abdominal masses. MSK:  Strength and tone appear normal for age. 5/5 in all extremities Extremities: No edema. No clubbing or cyanosis. DP/PT pulses 2+ bilaterally Neuro: Alert and oriented. No focal deficits. No facial asymmetry. MAE spontaneously. Psych: Responds to questions appropriately with normal affect.    The patient was seen and examined by Dr. SwazilandJordan who feels that she is stable and ready for discharge on 01/13/18.  _____________  Discharge Vitals Blood pressure (!) 152/96, pulse 76, temperature 97.8 F (36.6 C), temperature source Oral, resp. rate 15, height 4\' 10"  (1.473 m), weight 56.1 kg, SpO2 100 %.  Filed Weights   01/11/18 0043 01/12/18 0610 01/13/18 0516  Weight: 57.2 kg 57.1 kg 56.1 kg   Labs & Radiologic Studies    CBC Recent Labs    01/10/18 2345 01/11/18 0342  WBC  --  10.7*  10.7*  HGB 14.6 14.6  14.7  HCT 43.0 43.9  43.5  MCV  --  87.5  87.7  PLT  --  225  209   Basic Metabolic Panel Recent Labs    40/98/1110/02/17 1858 01/12/18 0427 01/13/18 0423  NA 135 137 137  K 2.9* 4.5 3.6  CL 104 109 107  CO2 21* 24 21*  GLUCOSE 132* 85 79  BUN 10 8 9   CREATININE 0.69 0.72 0.74  CALCIUM 8.9 9.0 8.8*  MG 1.9  --   --    Cardiac Enzymes Recent Labs    01/11/18 0342 01/11/18 0850 01/11/18 1416  TROPONINI 33.64* 37.39* 21.22*   Fasting Lipid Panel Recent Labs    01/11/18 0342  CHOL 207*  HDL 45  LDLCALC 143*  TRIG 97  CHOLHDL 4.6   Thyroid Function Tests Recent Labs    01/11/18 0342  TSH 3.607   _____________  Dg Chest 2 View  Result Date: 01/11/2018 CLINICAL DATA:  Short of breath today EXAM: CHEST - 2 VIEW COMPARISON:  None. FINDINGS: Normal heart size. Lungs clear. No pneumothorax. No pleural effusion. IMPRESSION: No active cardiopulmonary disease. Electronically Signed   By: Jolaine Click M.D.   On: 01/11/2018 14:56   Disposition   Pt is being discharged home today in good condition.  Follow-up Plans & Appointments   Follow-up Information    Baldo Daub, MD Follow up on 01/19/2018.   Specialty:  Cardiology Why:  Your follow up appointment will be on 01/19/18 at 0900 with Dr. Dulce Sellar. This will be at the [facility] office and further appointments will be back in the [place] office.  Contact information: [address] [place] [address] [phone]          Discharge Instructions    Amb Referral to Cardiac Rehabilitation   Complete by:  As directed    Will send Cardiac Rehab referral to [place]   Diagnosis:   Coronary Stents STEMI       Discharge Medications   Allergies as of 01/13/2018      Reactions   Penicillins    Childhood allergy unsure of reaction     Med Rec must be completed prior to using this SMARTLINK        Acute coronary syndrome (MI, NSTEMI, STEMI, etc) this admission?: Yes.     AHA/ACC Clinical Performance & Quality Measures: 1. Aspirin prescribed? - Yes 2. ADP Receptor Inhibitor (Plavix/Clopidogrel, Brilinta/Ticagrelor or Effient/Prasugrel) prescribed (includes medically managed patients)? - Yes 3. Beta Blocker prescribed? - Yes 4. High Intensity Statin (Lipitor 40-80mg  or Crestor 20-40mg ) prescribed? - Yes 5. EF assessed during THIS hospitalization? - Yes 6. For EF <40%, was ACEI/ARB prescribed? - Not Applicable (EF >/= 40%) lisinopril ordered  7. For EF <40%, Aldosterone Antagonist (Spironolactone or Eplerenone) prescribed? - Not Applicable (EF >/= 40%) 8. Cardiac Rehab Phase II ordered (Included Medically managed Patients)? - Yes   Outstanding Labs/Studies   Will need follow up LFT's and lipid panel in 6-8 weeks.   Duration of Discharge Encounter   Greater than 30 minutes including physician time.  Signed, Georgie Chard, NP 01/13/2018, 8:26 AM

## 2018-01-12 NOTE — Progress Notes (Signed)
Progress Note  Patient Name: Melanie Johnson Date of Encounter: 01/12/2018  Primary Cardiologist: Dr. Rayne Du, MD   Subjective   Pt feeling well today. No recurrent chest pain or SOB  Inpatient Medications    Scheduled Meds: . aspirin EC  81 mg Oral Daily  . atorvastatin  80 mg Oral q1800  . carvedilol  3.125 mg Oral BID WC  . heparin  5,000 Units Subcutaneous Q8H  . lisinopril  5 mg Oral Daily  . sodium chloride flush  3 mL Intravenous Q12H  . ticagrelor  90 mg Oral BID   Continuous Infusions: . sodium chloride     PRN Meds: sodium chloride, acetaminophen, nitroGLYCERIN, ondansetron (ZOFRAN) IV, oxyCODONE, sodium chloride flush   Vital Signs    Vitals:   01/11/18 1357 01/11/18 1750 01/11/18 2044 01/12/18 0610  BP: 129/87 124/86 133/67 110/74  Pulse: 62 64 77 68  Resp:   18 16  Temp: 98.2 F (36.8 C)  97.6 F (36.4 C) 98.5 F (36.9 C)  TempSrc: Oral  Oral Oral  SpO2: 99%  100% 98%  Weight:    57.1 kg  Height:        Intake/Output Summary (Last 24 hours) at 01/12/2018 1118 Last data filed at 01/12/2018 0500 Gross per 24 hour  Intake 540 ml  Output -  Net 540 ml   Filed Weights   01/11/18 0043 01/12/18 0610  Weight: 57.2 kg 57.1 kg   Physical Exam   General: Well developed, well nourished, NAD Skin: Warm, dry, intact  Head: Normocephalic, atraumatic, clear, moist mucus membranes. Neck: Negative for carotid bruits. No JVD Lungs:Clear to ausculation bilaterally. No wheezes, rales, or rhonchi. Breathing is unlabored. Cardiovascular: RRR with S1 S2. No murmurs, rubs, gallops, or LV heave appreciated. Abdomen: Soft, non-tender, non-distended with normoactive bowel sounds. No obvious abdominal masses. MSK: Strength and tone appear normal for age. 5/5 in all extremities Extremities: No edema. No clubbing or cyanosis. DP/PT pulses 2+ bilaterally Neuro: Alert and oriented. No focal deficits. No facial asymmetry. MAE spontaneously. Psych: Responds to  questions appropriately with normal affect.    Labs    Chemistry Recent Labs  Lab 01/11/18 1416 01/11/18 1858 01/12/18 0427  NA 136 135 137  K 3.5 2.9* 4.5  CL 106 104 109  CO2 23 21* 24  GLUCOSE 117* 132* 85  BUN 7 10 8   CREATININE 0.71 0.69 0.72  CALCIUM 9.0 8.9 9.0  GFRNONAA >60 >60 >60  GFRAA >60 >60 >60  ANIONGAP 7 10 4*    Hematology Recent Labs  Lab 01/10/18 2345 01/11/18 0342  WBC  --  10.7*  10.7*  RBC  --  5.02  4.96  HGB 14.6 14.6  14.7  HCT 43.0 43.9  43.5  MCV  --  87.5  87.7  MCH  --  29.1  29.6  MCHC  --  33.3  33.8  RDW  --  11.9  11.9  PLT  --  225  209    Cardiac Enzymes Recent Labs  Lab 01/11/18 0342 01/11/18 0850 01/11/18 1416  TROPONINI 33.64* 37.39* 21.22*   No results for input(s): TROPIPOC in the last 168 hours.   BNP Recent Labs  Lab 01/11/18 0342  BNP 74.6    DDimer No results for input(s): DDIMER in the last 168 hours.   Radiology    Dg Chest 2 View  Result Date: 01/11/2018 CLINICAL DATA:  Short of breath today EXAM: CHEST - 2 VIEW  COMPARISON:  None. FINDINGS: Normal heart size. Lungs clear. No pneumothorax. No pleural effusion. IMPRESSION: No active cardiopulmonary disease. Electronically Signed   By: Jolaine ClickArthur  Hoss M.D.   On: 01/11/2018 14:56   Telemetry    01/12/18 NSR with occasional PVC's. Had 4 beats of NSVT>>K replaced - Personally Reviewed  ECG    No new tracing as of 01/12/18- Personally Reviewed  Cardiac Studies   Cardiac catheterization 01/10/18:   Mid RCA lesion is 30% stenosed.  Ost RPDA to RPDA lesion is 40% stenosed.  Ost 1st Mrg to 1st Mrg lesion is 50% stenosed.  Mid Cx to Dist Cx lesion is 50% stenosed.  Prox LAD lesion is 40% stenosed.  Mid LAD lesion is 100% stenosed.  A drug-eluting stent was successfully placed using a STENT RESOLUTE ONYX 2.0X26.  Post intervention, there is a 0% residual stenosis.  There is moderate to severe left ventricular systolic dysfunction.  LV  end diastolic pressure is normal.  1. Total occlusion of the mid LAD, treated successfully with primary PCI using a 2.0 x 26 mm resolute Onyx DES 2. Patent left main without significant stenosis 3. Mild to moderate nonobstructive stenosis of the left circumflex 4. Mild nonobstructive stenosis of the RCA 5. Moderately severe segmental LV systolic dysfunction consistent with acute anterior MI, LVEF estimated at 35 to 40%, normal LVEDP  Recommend: Post MI medical therapy. Check echo to further assess extent of LV dysfunction  Recommend uninterrupted dual antiplatelet therapy with Aspirin 81mg  daily and Ticagrelor 90mg  twice dailyfor a minimum of 12 months (ACS - Class I recommendation).  Echocardiogram 01/11/18: Study Conclusions  - Left ventricle: The cavity size was normal. Wall thickness was normal. Systolic function was moderately reduced. The estimated ejection fraction was in the range of 35% to 40%. Dyskinesis of the mid-apicalanteroseptal, anterior, and apical myocardium; consistent with ischemia or infarction in the distribution of the left anterior descending coronary artery. Akinesis and scarring in a limited area of the basal inferolateral myocardium; consistent with infarction in the distribution of the right coronary or left circumflex coronary artery. Features are consistent with a pseudonormal left ventricular filling pattern, with concomitant abnormal relaxation and increased filling pressure (grade 2 diastolic dysfunction). No evidence of thrombus. Acoustic contrast opacification revealed no evidence ofthrombus.  Patient Profile     52 y.o. female with a history of tobacco abuse and HTN, [facility] with [facility]I   Assessment & Plan    1. STEMI: -Pt presented with STEMI found to have occluded LAD now s/p PCI -Echocardiogram found to be 35-40%  -Peak trop 37.39>>down-trending>>21.22  on 01/11/18 -Denies recurrent chest pain  -Discussed importance of medication compliance -Will keep for one additional day and likely to be discharged tomorrow, 01/13/18 -Continue ASA, carvedilol, Lipitor   2. Ischemic cardiomyopathy: -Post-procedure echocardiogram with LVEF at 35-40% with dyskinesis of the mid-apicalanteroseptal, anterior, and apical myocardium; consistent with ischemia or infarction in the distribution of the left anterior descending coronary artery. Akinesis and scarring in a limited area of the basal inferolateral myocardium; consistent with infarction in the distribution of the right coronary or left circumflex coronary artery  -No s/s of fluid volume overload -Continue carvedilol, lisinopril   3. HLD: -LDL on admission noted to be elevated at 143 -Continue high intensity statin   3. Hypokalemia: - Resolved today, K+ 4.5 today   Signed, Georgie ChardJill Morene Cecilio NP-C [facility] Pager: [phone] 01/12/2018, 11:18 AM     For questions or updates, please contact  Please consult [email or web address] for contact info under Cardiology/STEMI.

## 2018-01-12 NOTE — Progress Notes (Signed)
CARDIAC REHAB PHASE I   Pt eating in room eating breakfast, states she has been ambulating without any concerns. Reinforced importance of ASA, and Brilinta. Heart healthy diet and smoking cessation tip sheet given to pt. Strongly encouraged cessation. Reviewed restrictions and exercise guidelines. 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 Will refer to CRP II , pt unsure if she will be able to attend due to lack of insurance. Pt hopeful for d/c today.  [phone] Reynold Boweneresa  Marice Angelino, RN BSN 01/12/2018 9:41 AM

## 2018-01-13 LAB — BASIC METABOLIC PANEL
ANION GAP: 9 (ref 5–15)
BUN: 9 mg/dL (ref 6–20)
CHLORIDE: 107 mmol/L (ref 98–111)
CO2: 21 mmol/L — ABNORMAL LOW (ref 22–32)
CREATININE: 0.74 mg/dL (ref 0.44–1.00)
Calcium: 8.8 mg/dL — ABNORMAL LOW (ref 8.9–10.3)
GFR calc non Af Amer: >60 mL/min (ref >60)
Glucose, Bld: 79 mg/dL (ref 70–99)
Potassium: 3.6 mmol/L (ref 3.5–5.1)
SODIUM: 137 mmol/L (ref 135–145)

## 2018-01-13 MED ORDER — TICAGRELOR 90 MG PO TABS: 90.0000 mg | ORAL_TABLET | Freq: Two times a day (BID) | ORAL | 9 refills | Status: DC

## 2018-01-13 MED ORDER — ATORVASTATIN CALCIUM 80 MG PO TABS: 80.0000 mg | ORAL_TABLET | Freq: Every day | ORAL | 4 refills | Status: DC

## 2018-01-13 MED ORDER — CARVEDILOL 6.25 MG PO TABS: 6.2500 mg | ORAL_TABLET | Freq: Two times a day (BID) | ORAL | 4 refills | Status: DC

## 2018-01-13 MED ORDER — NITROGLYCERIN 0.4 MG SL SUBL: 0.4000 mg | SUBLINGUAL_TABLET | SUBLINGUAL | 0 refills | Status: DC | PRN

## 2018-01-13 MED ORDER — LISINOPRIL 5 MG PO TABS: 5.0000 mg | ORAL_TABLET | Freq: Every day | ORAL | 4 refills | Status: DC

## 2018-01-13 MED FILL — ATORVASTATIN CALCIUM 80 MG: 80 | 30 days supply | Qty: 30 | Fill #0

## 2018-01-13 MED FILL — CARVEDILOL 6.25 MG TABLET: 6.25 | 30 days supply | Qty: 60 | Fill #0

## 2018-01-13 MED FILL — BRILINTA 90 MG TABLET: 90 | 30 days supply | Qty: 60 | Fill #0

## 2018-01-13 MED FILL — LISINOPRIL 5 MG TABLET: 5 | 30 days supply | Qty: 30 | Fill #0

## 2018-01-13 MED FILL — NITROGLYCERIN 0.4 MG TAB SL: 0.4 | 7 days supply | Qty: 25 | Fill #0

## 2018-01-13 NOTE — Care Management (Addendum)
1132 MATCH utilized and patient will pay $3.00 for each Rx. Transitions of Care Pharmacy to deliver the medications to the bedside before patient transitions home. Pt has PCP- No further needs from CM at this time. Gala LewandowskyGraves-Bigelow, Rekisha Welling Kaye, RN,BSN [phone]

## 2018-01-13 NOTE — Progress Notes (Signed)
CARDIAC REHAB PHASE I   Pt excited for d/c, waiting on meds and discharge paperwork. Reinforced importance of smoking cessation with pt. Tip sheet at bedside. Pt educated on relation between heart disease and smoking and states she understands. Pt expresses willingness to "try". Pt denies any further questions or concerns. Pt referred to CRP II [place].  [phone] Reynold Boweneresa  Alva Broxson, RN BSN 01/13/2018 10:29 AM

## 2018-01-13 NOTE — Discharge Summary (Signed)
Discharge Summary    Patient ID: Trishelle Devora MRN: [id number]; DOB: Sep 04, 1965  Admit date: 01/10/2018 Discharge date: 01/13/2018  Primary Care Provider: No primary care provider on file.  Primary Cardiologist: Norman Herrlich, MD   Discharge Diagnoses    Active Problems:   Acute ST elevation myocardial infarction (STEMI) of anterior wall (HCC)   ST elevation myocardial infarction involving left anterior descending (LAD) coronary artery (HCC)  Allergies Allergies  Allergen Reactions  . Penicillins     Childhood allergy unsure of reaction    Diagnostic Studies/Procedures    Cardiac catheterization 01/10/18:   Mid RCA lesion is 30% stenosed.  Ost RPDA to RPDA lesion is 40% stenosed.  Ost 1st Mrg to 1st Mrg lesion is 50% stenosed.  Mid Cx to Dist Cx lesion is 50% stenosed.  Prox LAD lesion is 40% stenosed.  Mid LAD lesion is 100% stenosed.  A drug-eluting stent was successfully placed using a STENT RESOLUTE ONYX 2.0X26.  Post intervention, there is a 0% residual stenosis.  There is moderate to severe left ventricular systolic dysfunction.  LV end diastolic pressure is normal.   1.  Total occlusion of the mid LAD, treated successfully with primary PCI using a 2.0 x 26 mm resolute Onyx DES 2.  Patent left main without significant stenosis 3.  Mild to moderate nonobstructive stenosis of the left circumflex 4.  Mild nonobstructive stenosis of the RCA 5.  Moderately severe segmental LV systolic dysfunction consistent with acute anterior MI, LVEF estimated at 35 to 40%, normal LVEDP  Recommend: Post MI medical therapy.  Check echo to further assess extent of LV dysfunction  Recommend uninterrupted dual antiplatelet therapy with Aspirin 81mg  daily and Ticagrelor 90mg  twice daily for a minimum of 12 months (ACS - Class I recommendation).  Echocardiogram 01/11/18: Study Conclusions  - Left ventricle: The cavity size was normal. Wall thickness was   normal. Systolic  function was moderately reduced. The estimated   ejection fraction was in the range of 35% to 40%. Dyskinesis of   the mid-apicalanteroseptal, anterior, and apical myocardium;   consistent with ischemia or infarction in the distribution of the   left anterior descending coronary artery. Akinesis and scarring   in a limited area of the basal inferolateral myocardium;   consistent with infarction in the distribution of the right   coronary or left circumflex coronary artery. Features are   consistent with a pseudonormal left ventricular filling pattern,   with concomitant abnormal relaxation and increased filling   pressure (grade 2 diastolic dysfunction). No evidence of   thrombus. Acoustic contrast opacification revealed no evidence   ofthrombus.  History of Present Illness     Melanie Johnson is a 52 y.o. female with a history of tobacco abuse and HTN, presenting in transfer from [facility] with an anterior STEMI  Ms. Defino awoke on the morning of admission with chest and arm discomfort that she describes as a pressure and aching sensation. The pain persisted much of the day and she ultimately presented to [facility] where an EKG demonstrated acute anterior injury. A code STEMI is called and she is transferred emergently for cardiac catheterization and possible PCI.  The patient has no past history of cardiac disease.  She is a 2 pack/day smoker. She has had issues with uncontrolled hypertension, but has been compliant with her medications and reports that her blood pressure has been reasonably well controlled until tonight's presentation when her blood pressure was greater than 200/100 mmHg. On presentation  to [facility], she denied chest pain or shortness of breath however had some nausea. She denied diaphoresis, lightheadedness, or syncope. She had some episodes of chest pain over the past few weeks but no sustained symptoms until day of presentation.  In route she was given morphine  with resolution.   Hospital Course     Cardiac cath revealed total occlusion of the mid LAD, treated successfully with primary PCI. There was mild to moderate nonobstructive stenosis of the left circumflex, mild nonobstructive stenosis of the RCA, moderately severe segmental LV systolic dysfunction consistent with acute anterior MI and an LVEF estimated at 35 to 40% with normal LVED. Recommendations were made for uninterrupted dual antiplatelet therapy with Aspirin 81mg  daily and Ticagrelor 90mg  twice daily for a minimum of 12 months. Troponin peak at 37.39   Unfortunately, her post cath echocardiogram revealed an EF of 35% to 40% with dyskinesis of the mid-apicalanteroseptal, anterior, and apical myocardium, consistent with ischemia or infarction in the distribution of the left anterior descending coronary artery. There is akinesis and scarring in a limited area of the basal inferolateral myocardium, consistent with infarction in the distribution of the right coronary or left circumflex coronary artery. There is G2DD.   She has done well post-cath. Right radial site free of hematoma. She has not had recurrent chest pain. She has ambulated with cardiac rehabilitation without complication. She has no signs of fluid volume overload related to LV dysfunction. She had mild complaints of SOB on 01/11/18 which has now resolved. May need to monitor for SOB related to Brilinta initiation. LDL was noted to be 143 on admission.  Consultants: None   General: Well developed, well nourished, NAD Skin: Warm, dry, intact  Head: Normocephalic, atraumatic, clear, moist mucus membranes. Neck: Negative for carotid bruits. No JVD Lungs:Clear to ausculation bilaterally. No wheezes, rales, or rhonchi. Breathing is unlabored. Cardiovascular: RRR with S1 S2. No murmurs, rubs, gallops, or LV heave appreciated. Abdomen: Soft, non-tender, non-distended with normoactive bowel sounds. No obvious abdominal masses. MSK:  Strength and tone appear normal for age. 5/5 in all extremities Extremities: No edema. No clubbing or cyanosis. DP/PT pulses 2+ bilaterally Neuro: Alert and oriented. No focal deficits. No facial asymmetry. MAE spontaneously. Psych: Responds to questions appropriately with normal affect.    The patient was seen and examined by Dr. [place] who feels that she is stable and ready for discharge on 01/13/18.  _____________  Discharge Vitals Blood pressure (!) 412/95, pulse 68, temperature 97.8 F (36.6 C), temperature source Oral, resp. rate 15, height 4\' 10"  (1.473 m), weight 56.1 kg, SpO2 100 %.  Filed Weights   01/11/18 0043 01/12/18 0610 01/13/18 0516  Weight: 57.2 kg 57.1 kg 56.1 kg   Labs & Radiologic Studies    CBC Recent Labs    01/10/18 2345 01/11/18 0342  WBC  --  10.7*  10.7*  HGB 14.6 14.6  14.7  HCT 43.0 43.9  43.5  MCV  --  87.5  87.7  PLT  --  225  209   Basic Metabolic Panel Recent Labs    16/10/96 1858 01/12/18 0427 01/13/18 0423  NA 135 137 137  K 2.9* 4.5 3.6  CL 104 109 107  CO2 21* 24 21*  GLUCOSE 132* 85 79  BUN 10 8 9   CREATININE 0.69 0.72 0.74  CALCIUM 8.9 9.0 8.8*  MG 1.9  --   --    Cardiac Enzymes Recent Labs    01/11/18 0342 01/11/18 0850 01/11/18 1416  TROPONINI 33.64* 37.39* 21.22*   Fasting Lipid Panel Recent Labs    01/11/18 0342  CHOL 207*  HDL 45  LDLCALC 143*  TRIG 97  CHOLHDL 4.6   Thyroid Function Tests Recent Labs    01/11/18 0342  TSH 3.607   _____________  Dg Chest 2 View  Result Date: 01/11/2018 CLINICAL DATA:  Short of breath today EXAM: CHEST - 2 VIEW COMPARISON:  None. FINDINGS: Normal heart size. Lungs clear. No pneumothorax. No pleural effusion. IMPRESSION: No active cardiopulmonary disease. Electronically Signed   By: Jolaine Click M.D.   On: 01/11/2018 14:56   Disposition   Pt is being discharged home today in good condition.  Follow-up Plans & Appointments   Follow-up Information    Baldo Daub, MD Follow up on 01/19/2018.   Specialty:  Cardiology Why:  Your follow up appointment will be on 01/19/18 at 0900 with Dr. Dulce Sellar. This will be at the [facility] office and further appointments will be back in the [place] office.  Contact information: [address] [place] [address] [phone]          Discharge Instructions    (HEART FAILURE PATIENTS) Call MD:  Anytime you have any of the following symptoms: 1) 3 pound weight gain in 24 hours or 5 pounds in 1 week 2) shortness of breath, with or without a dry hacking cough 3) swelling in the hands, feet or stomach 4) if you have to sleep on extra pillows at night in order to breathe.   Complete by:  As directed    Amb Referral to Cardiac Rehabilitation   Complete by:  As directed    Will send Cardiac Rehab referral to [place]   Diagnosis:   Coronary Stents STEMI     Call MD for:  difficulty breathing, headache or visual disturbances   Complete by:  As directed    Call MD for:  extreme fatigue   Complete by:  As directed    Call MD for:  persistant dizziness or light-headedness   Complete by:  As directed    Call MD for:  persistant nausea and vomiting   Complete by:  As directed    Call MD for:  redness, tenderness, or signs of infection (pain, swelling, redness, odor or green/yellow discharge around incision site)   Complete by:  As directed    Call MD for:  severe uncontrolled pain   Complete by:  As directed    Call MD for:  temperature >100.4   Complete by:  As directed    Diet - low sodium heart healthy   Complete by:  As directed    Discharge instructions   Complete by:  As directed    No driving for 3 days. No lifting over 5 lbs for 1 week. No sexual activity for 1 week. Keep procedure site clean & dry. If you notice increased pain, swelling, bleeding or pus, call/return!  You may shower, but no soaking baths/hot tubs/pools for 1 week.   PLEASE DO NOT MISS ANY DOSES OF YOUR  BRILINTA!!!!! Also keep a log of you blood pressures and bring back to your follow up appt. Please call the office with any questions.   Patients taking blood thinners should generally stay away from medicines like ibuprofen, Advil, Motrin, naproxen, and Aleve due to risk of stomach bleeding. You may take Tylenol as directed or talk to your primary doctor about alternatives.  Some studies suggest Prilosec/Omeprazole interacts  with Plavix. If you have reflux symptoms, please use Protonix for less chance of interaction.   One of your heart tests showed weakness of the heart muscle this admission. This may make you more susceptible to weight gain from fluid retention, which can lead to symptoms that we call heart failure. Please follow these special instructions:  1. Follow a low-salt diet - you are allowed no more than 2,000mg  of sodium per day. Watch your fluid intake. In general, you should not be taking in more than 2 liters of fluid per day (no more than 8 glasses per day). This includes sources of water in foods like soup, coffee, tea, milk, etc. 2. Weigh yourself on the same scale at same time of day and keep a log. 3. Call your doctor: (Anytime you feel any of the following symptoms)  - 3lb weight gain overnight or 5lb within a few days - Shortness of breath, with or without a dry hacking cough  - Swelling in the hands, feet or stomach  - If you have to sleep on extra pillows at night in order to breathe   IT IS IMPORTANT TO LET YOUR DOCTOR KNOW EARLY ON IF YOU ARE HAVING SYMPTOMS SO WE CAN HELP YOU!   Increase activity slowly   Complete by:  As directed      Discharge Medications   Allergies as of 01/13/2018      Reactions   Penicillins    Childhood allergy unsure of reaction      Allergies as of 01/13/2018      Reactions   Penicillins    Childhood allergy unsure of reaction       Medication List    STOP taking these medications   lisinopril-hydrochlorothiazide 20-25 MG  tablet Commonly known as:  PRINZIDE,ZESTORETIC   metoprolol succinate 100 MG 24 hr tablet Commonly known as:  TOPROL-XL     TAKE these medications   amLODipine 5 MG tablet Commonly known as:  NORVASC Take 1 tablet (5 mg total) by mouth daily.   aspirin EC 81 MG tablet Take 81 mg by mouth daily.   atorvastatin 80 MG tablet Commonly known as:  LIPITOR Take 1 tablet (80 mg total) by mouth daily.   carvedilol 6.25 MG tablet Commonly known as:  COREG Take 1 tablet (6.25 mg total) by mouth 2 (two) times daily with a meal.   levothyroxine 75 MCG tablet Commonly known as:  SYNTHROID, LEVOTHROID Take 75 mcg by mouth daily before breakfast.   lisinopril 5 MG tablet Commonly known as:  PRINIVIL,ZESTRIL Take 1 tablet (5 mg total) by mouth daily.   nitroGLYCERIN 0.4 MG SL tablet Commonly known as:  NITROSTAT Place 1 tablet (0.4 mg total) under the tongue every 5 (five) minutes x 3 doses as needed for chest pain.   ticagrelor 90 MG Tabs tablet Commonly known as:  BRILINTA Take 1 tablet (90 mg total) by mouth 2 (two) times daily.      Acute coronary syndrome (MI, NSTEMI, STEMI, etc) this admission?: Yes.     AHA/ACC Clinical Performance & Quality Measures: 1. Aspirin prescribed? - Yes 2. ADP Receptor Inhibitor (Plavix/Clopidogrel, Brilinta/Ticagrelor or Effient/Prasugrel) prescribed (includes medically managed patients)? - Yes 3. Beta Blocker prescribed? - Yes 4. High Intensity Statin (Lipitor 40-80mg  or Crestor 20-40mg ) prescribed? - Yes 5. EF assessed during THIS hospitalization? - Yes 6. For EF <40%, was ACEI/ARB prescribed? - Not Applicable (EF >/= 40%) lisinopril ordered  7. For EF <40%, Aldosterone Antagonist (Spironolactone or  Eplerenone) prescribed? - Not Applicable (EF >/= 40%) 8. Cardiac Rehab Phase II ordered (Included Medically managed Patients)? - Yes   Outstanding Labs/Studies   Will need follow up LFT's and lipid panel in 6-8 weeks.   Duration of Discharge  Encounter   Greater than 30 minutes including physician time.  Signed, Georgie ChardJill , NP 01/13/2018, 10:32 AM

## 2018-01-14 ENCOUNTER — Telehealth: Payer: Self-pay | Admitting: Cardiology

## 2018-01-14 NOTE — Telephone Encounter (Signed)
°*  STAT* If patient is at the pharmacy, call can be transferred to refill team.   1. Which medications need to be refilled? (please list name of each medication and dose if known) Amlodipine 5mg  takes takes 1 tablet daily  2. Which pharmacy/location (including street and city if local pharmacy) is medication to be sent to [place]  3. Do they need a 30 day or 90 day supply? 90   [facility]

## 2018-01-14 NOTE — Telephone Encounter (Signed)
Call to [facility] in [place] instead

## 2018-01-14 NOTE — Telephone Encounter (Signed)
Left message advising patient that we cannot refill any of her medications until she has been established as a patient in our office. Advised her that if she needed refills before her upcoming appointment with Dr. Dulce SellarMunley on 01/19/18 to contact her PCP for refills. Informed her to contact our office with any further questions or concerns.

## 2018-01-18 DIAGNOSIS — I2510 Atherosclerotic heart disease of native coronary artery without angina pectoris: Secondary | ICD-10-CM | POA: Insufficient documentation

## 2018-01-18 DIAGNOSIS — I255 Ischemic cardiomyopathy: Secondary | ICD-10-CM

## 2018-01-18 DIAGNOSIS — E785 Hyperlipidemia, unspecified: Secondary | ICD-10-CM | POA: Insufficient documentation

## 2018-01-18 HISTORY — DX: Atherosclerotic heart disease of native coronary artery without angina pectoris: I25.10

## 2018-01-18 HISTORY — DX: Hyperlipidemia, unspecified: E78.5

## 2018-01-18 HISTORY — DX: Ischemic cardiomyopathy: I25.5

## 2018-01-18 NOTE — Progress Notes (Signed)
Cardiology Office Note:    Date:  01/19/2018   ID:  Melanie NeighborsSherry Johnson, DOB Jun 23, 1965, MRN [id number]  PCP:  Alinda DeemPenner, Pamela, MD  Cardiologist:  Norman HerrlichBrian Bonnye Halle, MD    Referring MD: No ref. provider found    ASSESSMENT:    1. ST elevation myocardial infarction involving left anterior descending (LAD) coronary artery (HCC)   2. CAD in native artery   3. Ischemic cardiomyopathy   4. Pure hypercholesterolemia    PLAN:    In order of problems listed above:  1. She had leg and presenting to medical care of up to 12 hours and is suffered an extensive myocardial infarction with moderate to severe LV dysfunction.  Tinea dual antiplatelet therapy recheck echocardiogram regarding aneurysm thrombus and transition from ACE inhibitor to [facility] with EF less than 40 2. See above fortunately has stopped smoking cardiac rehab is not a financial option 3. Check echocardiogram regarding thrombus continue carvedilol check renal function transition to Entresto if EF remains less than 40 add spironolactone 4. Continue high intensity statin LP(a) at her next visit with young age and myocardial infarction   Next appointment: Weeks   Medication Adjustments/Labs and Tests Ordered: Current medicines are reviewed at length with the patient today.  Concerns regarding medicines are outlined above.  No orders of the defined types were placed in this encounter.  No orders of the defined types were placed in this encounter.   No chief complaint on file.   History of Present Illness:    Melanie NeighborsSherry Johnson is a 52 y.o. female with a hx of anterior STEMI last [facility] at which time she had PCI and stenting of her left anterior descending coronary artery.  Ejection fraction was 35 to 40% with dyskinesia of the mid and apical anterior septal anterior and apical myocardium there is no left ventricular thrombus.  LDL was markedly elevated 130143 admission to the hospital.   Moderate to severe severe LV dysfunction was seen on echo and troponin was markedly elevated.   Ref Range & Units 7d ago (01/11/18) 7d ago (01/11/18) 7d ago (01/11/18)  Troponin I <0.03 ng/mL 21.22High Panic   37.39High Panic  CM 33.64High Panic  CM   Echo:  Study Conclusions - Left ventricle: The cavity size was normal. Wall thickness was normal. Systolic function was moderately reduced. The estimated   ejection fraction was in the range of 35% to 40%. Dyskinesis of the mid-apicalanteroseptal, anterior, and apical myocardium;   consistent with ischemia or infarction in the distribution of the  left anterior descending coronary artery. Akinesis and scarring   in a limited area of the basal inferolateral myocardium;  consistent with infarction in the distribution of the right   coronary or left circumflex coronary artery. Features areconsistent with a pseudonormal left ventricular filling pattern,   with concomitant abnormal relaxation and increased filling pressure (grade 2 diastolic dysfunction). No evidence of   thrombus. Acoustic contrast opacification revealed no evidence  of thrombus.  Compliance with diet, lifestyle and medications: Yes  Review history she had about a 12-hour delay from the onset of anginal symptoms to presenting to the emergency room and urgent PCI and stent.  Surprisingly with the degree of LV dysfunction she feels well she is not short of breath no weakness exercise intolerance chest pain palpitation or syncope.  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  She has stopped smoking.  There are EF less than 40% we will stop ACE inhibitor weight 3 days and place her on Entresto.  She has been having diarrhea thinks it might be due to her carvedilol but is improving spontaneously and I asked her to remain on it and use  over-the-counter Imodium.  She said some heartburn and indigestion with dual antiplatelet therapy and I gave her prescription for Prilosec 20 mg to take daily.  EEG suggested development of aneurysm at high risk of LV thrombus we will recheck an echocardiogram next week she is initially hypokalemic in the hospital recheck a BMP and a proBNP level.  Past Medical History:  Diagnosis Date  . Hypertension   . Thyroid disease     Past Surgical History:  Procedure Laterality Date  . CORONARY/GRAFT ACUTE MI REVASCULARIZATION N/A 01/10/2018   Procedure: Coronary/Graft Acute MI Revascularization;  Surgeon: Tonny Bollman, MD;  Location: [facility] INVASIVE CV LAB;  Service: Cardiovascular;  Laterality: N/A;  . LEFT HEART CATH AND CORONARY ANGIOGRAPHY N/A 01/10/2018   Procedure: LEFT HEART CATH AND CORONARY ANGIOGRAPHY;  Surgeon: Tonny Bollman, MD;  Location: [facility] INVASIVE CV LAB;  Service: Cardiovascular;  Laterality: N/A;    Current Medications: Current Meds  Medication Sig  . amLODipine (NORVASC) 5 MG tablet Take 1 tablet (5 mg total) by mouth daily.  Marland Kitchen aspirin EC 81 MG tablet Take 81 mg by mouth daily.  Marland Kitchen atorvastatin (LIPITOR) 80 MG tablet Take 1 tablet (80 mg total) by mouth daily.  . carvedilol (COREG) 6.25 MG tablet Take 1 tablet (6.25 mg total) by mouth 2 (two) times daily with a meal.  . levothyroxine (SYNTHROID, LEVOTHROID) 75 MCG tablet Take 75 mcg by mouth daily before breakfast.  . lisinopril (PRINIVIL,ZESTRIL) 5 MG tablet Take 1 tablet (5 mg total) by mouth daily.  . nitroGLYCERIN (NITROSTAT) 0.4 MG SL tablet Place 1 tablet (0.4 mg total) under the tongue every 5 (five) minutes x 3 doses as needed for chest pain.  . ticagrelor (BRILINTA) 90 MG TABS tablet Take 1 tablet (90 mg total) by mouth 2 (two) times daily.     Allergies:   Penicillins   Social History   Socioeconomic History  . Marital status: Married    Spouse name: Not on file  . Number of children: Not on file  . Years of  education: Not on file  . Highest education level: Not on file  Occupational History  . Not on file  Social Needs  . Financial resource strain: Not hard at all  . Food insecurity:    Worry: Never true    Inability: Never true  . Transportation needs:    Medical: No    Non-medical: No  Tobacco Use  . Smoking status: Former Smoker    Packs/day: 2.00    Years: 25.00    Pack years: 50.00    Types: Cigarettes    Last attempt to quit: 01/11/2018    Years since quitting: 0.0  . Smokeless tobacco: Never Used  Substance and Sexual Activity  . Alcohol use: No    Frequency: Never  . Drug use: No  . Sexual activity: Not on file  Lifestyle  . Physical activity:    Days per week: 2 days    Minutes per session: 20 min  . Stress: Not at all  Relationships  . Social connections:    Talks on phone: More than three times a  week    Gets together: More than three times a week    Attends religious service: 1 to 4 times per year    Active member of club or organization: Yes    Attends meetings of clubs or organizations: 1 to 4 times per year    Relationship status: Married  Other Topics Concern  . Not on file  Social History Narrative  . Not on file     Family History: The patient's family history includes CAD in her father; Heart attack in her father; Heart disease in her father. ROS:   Please see the history of present illness.    All other systems reviewed and are negative.  EKGs/Labs/Other Studies Reviewed:    The following studies were reviewed today:  EKG:  EKG ordered today.  The ekg ordered today demonstrates sinus rhythm QS V1 to V4 there is residual ST elevation T inversion consider aneurysm with recent anterior wall myocardial infarction EKG 01/11/18 with SRTH qs v1-V4  Recent Labs: 01/11/2018: B Natriuretic Peptide 74.6; Hemoglobin 14.6; Hemoglobin 14.7; Magnesium 1.9; Platelets 225; Platelets 209; TSH 3.607 01/13/2018: BUN 9; Creatinine, Ser 0.74; Potassium 3.6;  Sodium 137  Recent Lipid Panel    Component Value Date/Time   CHOL 207 (H) 01/11/2018 0342   TRIG 97 01/11/2018 0342   HDL 45 01/11/2018 0342   CHOLHDL 4.6 01/11/2018 0342   VLDL 19 01/11/2018 0342   LDLCALC 143 (H) 01/11/2018 0342    Physical Exam:    VS:  Ht 4' 10.5" (1.486 m)   Wt 125 lb (56.7 kg)   BMI 25.68 kg/m     Wt Readings from Last 3 Encounters:  01/19/18 125 lb (56.7 kg)  01/13/18 123 lb 9.6 oz (56.1 kg)     GEN:  Well nourished, well developed in no acute distress HEENT: Normal NECK: No JVD; No carotid bruits LYMPHATICS: No lymphadenopathy CARDIAC: RRR, no murmurs, rubs, gallops RESPIRATORY:  Clear to auscultation without rales, wheezing or rhonchi  ABDOMEN: Soft, non-tender, non-distended MUSCULOSKELETAL:  No edema; No deformity  SKIN: Warm and dry NEUROLOGIC:  Alert and oriented x 3 PSYCHIATRIC:  Normal affect    Signed, Norman Herrlich, MD  01/19/2018 9:12 AM    [facility]

## 2018-01-19 ENCOUNTER — Encounter: Payer: Self-pay | Admitting: *Deleted

## 2018-01-19 ENCOUNTER — Encounter: Payer: Self-pay | Admitting: Cardiology

## 2018-01-19 ENCOUNTER — Ambulatory Visit (INDEPENDENT_AMBULATORY_CARE_PROVIDER_SITE_OTHER): Payer: Medicaid Other | Admitting: Cardiology

## 2018-01-19 VITALS — BP 124/80 | HR 66 | Ht 58.5 in | Wt 125.0 lb

## 2018-01-19 DIAGNOSIS — I255 Ischemic cardiomyopathy: Secondary | ICD-10-CM

## 2018-01-19 DIAGNOSIS — I2102 ST elevation (STEMI) myocardial infarction involving left anterior descending coronary artery: Secondary | ICD-10-CM | POA: Diagnosis not present

## 2018-01-19 DIAGNOSIS — E7800 Pure hypercholesterolemia, unspecified: Secondary | ICD-10-CM

## 2018-01-19 DIAGNOSIS — I2510 Atherosclerotic heart disease of native coronary artery without angina pectoris: Secondary | ICD-10-CM | POA: Diagnosis not present

## 2018-01-19 MED ORDER — OMEPRAZOLE 20 MG PO CPDR: 20.0000 mg | DELAYED_RELEASE_CAPSULE | Freq: Every day | ORAL | 3 refills | Status: DC

## 2018-01-19 MED ORDER — SACUBITRIL-VALSARTAN 24-26 MG PO TABS: 1.0000 | ORAL_TABLET | Freq: Two times a day (BID) | ORAL | 3 refills | Status: DC

## 2018-01-19 NOTE — Patient Instructions (Addendum)
Medication Instructions:  Your physician has recommended you make the following change in your medication:   STOP amlodipine STOP lisinopril for THREE FULL DAYS  START sacubitril-valsartan (entresto) 24-26 mg: Take 1 tablet twice daily.Marland KitchenMarland KitchenSTART AFTER STOPPING LISINOPRIL FOR THREE FULL DAYS START omeprazole (prilosec) 20 mg: Take 1 capsule daily  If you need a refill on your cardiac medications before your next appointment, please call your pharmacy.   Lab work: Your physician recommends that you return for lab work today: BMP, ProBNP.   If you have labs (blood work) drawn today and your tests are completely normal, you will receive your results only by: Marland Kitchen MyChart Message (if you have MyChart) OR . A paper copy in the mail If you have any lab test that is abnormal or we need to change your treatment, we will call you to review the results.  Testing/Procedures: You had an EKG today.   Your physician has requested that you have an echocardiogram. Echocardiography is a painless test that uses sound waves to create images of your heart. It provides your doctor with information about the size and shape of your heart and how well your heart's chambers and valves are working. This procedure takes approximately one hour. There are no restrictions for this procedure.  Follow-Up: At [facility], you and your health needs are our priority.  As part of our continuing mission to provide you with exceptional heart care, we have created designated Provider Care Teams.  These Care Teams include your primary Cardiologist (physician) and Advanced Practice Providers (APPs -  Physician Assistants and Nurse Practitioners) who all work together to provide you with the care you need, when you need it. You will need a follow up appointment in 4 weeks.        Steps to Quit Smoking Smoking tobacco can be bad for your health. It can also affect almost every organ in your body. Smoking puts you and people  around you at risk for many serious long-lasting (chronic) diseases. Quitting smoking is hard, but it is one of the best things that you can do for your health. It is never too late to quit. What are the benefits of quitting smoking? When you quit smoking, you lower your risk for getting serious diseases and conditions. They can include:  Lung cancer or lung disease.  Heart disease.  Stroke.  Heart attack.  Not being able to have children (infertility).  Weak bones (osteoporosis) and broken bones (fractures).  If you have coughing, wheezing, and shortness of breath, those symptoms may get better when you quit. You may also get sick less often. If you are pregnant, quitting smoking can help to lower your chances of having a baby of low birth weight. What can I do to help me quit smoking? Talk with your doctor about what can help you quit smoking. Some things you can do (strategies) include:  Quitting smoking totally, instead of slowly cutting back how much you smoke over a period of time.  Going to in-person counseling. You are more likely to quit if you go to many counseling sessions.  Using resources and support systems, such as: ? Agricultural engineer with a Veterinary surgeon. ? Phone quitlines. ? Automotive engineer. ? Support groups or group counseling. ? Text messaging programs. ? Mobile phone apps or applications.  Taking medicines. Some of these medicines may have nicotine in them. If you are pregnant or breastfeeding, do not take any medicines to quit smoking unless your doctor says it  is okay. Talk with your doctor about counseling or other things that can help you.  Talk with your doctor about using more than one strategy at the same time, such as taking medicines while you are also going to in-person counseling. This can help make quitting easier. What things can I do to make it easier to quit? Quitting smoking might feel very hard at first, but there is a lot that you can do  to make it easier. Take these steps:  Talk to your family and friends. Ask them to support and encourage you.  Call phone quitlines, reach out to support groups, or work with a Veterinary surgeon.  Ask people who smoke to not smoke around you.  Avoid places that make you want (trigger) to smoke, such as: ? Bars. ? Parties. ? Smoke-break areas at work.  Spend time with people who do not smoke.  Lower the stress in your life. Stress can make you want to smoke. Try these things to help your stress: ? Getting regular exercise. ? Deep-breathing exercises. ? Yoga. ? Meditating. ? Doing a body scan. To do this, close your eyes, focus on one area of your body at a time from head to toe, and notice which parts of your body are tense. Try to relax the muscles in those areas.  Download or buy apps on your mobile phone or tablet that can help you stick to your quit plan. There are many free apps, such as QuitGuide from the [employer] developer for Disease Control and Prevention). You can find more support from smokefree.gov and other websites.  This information is not intended to replace advice given to you by your health care provider. Make sure you discuss any questions you have with your health care provider. Document Released: 12/13/2008 Document Revised: 10/15/2015 Document Reviewed: 07/03/2014 Elsevier Interactive Patient Education  2018 [employer].    Echocardiogram An echocardiogram, or echocardiography, uses sound waves (ultrasound) to produce an image of your heart. The echocardiogram is simple, painless, obtained within a short period of time, and offers valuable information to your health care provider. The images from an echocardiogram can provide information such as:  Evidence of coronary artery disease (CAD).  Heart size.  Heart muscle function.  Heart valve function.  Aneurysm detection.  Evidence of a past heart attack.  Fluid buildup around the heart.  Heart muscle  thickening.  Assess heart valve function.  Tell a health care provider about:  Any allergies you have.  All medicines you are taking, including vitamins, herbs, eye drops, creams, and over-the-counter medicines.  Any problems you or family members have had with anesthetic medicines.  Any blood disorders you have.  Any surgeries you have had.  Any medical conditions you have.  Whether you are pregnant or may be pregnant. What happens before the procedure? No special preparation is needed. Eat and drink normally. What happens during the procedure?  In order to produce an image of your heart, gel will be applied to your chest and a wand-like tool (transducer) will be moved over your chest. The gel will help transmit the sound waves from the transducer. The sound waves will harmlessly bounce off your heart to allow the heart images to be captured in real-time motion. These images will then be recorded.  You may need an IV to receive a medicine that improves the quality of the pictures. What happens after the procedure? You may return to your normal schedule including diet, activities, and medicines, unless your  health care provider tells you otherwise. This information is not intended to replace advice given to you by your health care provider. Make sure you discuss any questions you have with your health care provider. Document Released: 02/14/2000 Document Revised: 10/05/2015 Document Reviewed: 10/24/2012 Elsevier Interactive Patient Education  2017 Elsevier Inc.    Sacubitril; Valsartan oral tablet What is this medicine? SACUBITRIL; VALSARTAN (sak UE bi tril; val SAR tan) is a combination of 2 drugs used to reduce the risk of death and hospitalizations in people with long-lasting heart failure. It is usually used with other medicines to treat heart failure. This medicine may be used for other purposes; ask your health care provider or pharmacist if you have questions. COMMON  BRAND NAME(S): Entresto What should I tell my health care provider before I take this medicine? They need to know if you have any of these conditions: -diabetes and take a medicine that contains aliskiren -kidney disease -liver disease -an unusual or allergic reaction to sacubitril; valsartan, drugs called angiotensin converting enzyme (ACE) inhibitors, angiotensin II receptor blockers (ARBs), other medicines, foods, dyes, or preservatives -pregnant or trying to get pregnant -breast-feeding How should I use this medicine? Take this medicine by mouth with a glass of water. Follow the directions on the prescription label. You can take it with or without food. If it upsets your stomach, take it with food. Take your medicine at regular intervals. Do not take it more often than directed. Do not stop taking except on your doctor's advice. Do not take this medicine for at least 36 hours before or after you take an ACE inhibitor medicine. Talk to your health care provider if you are not sure if you take an ACE inhibitor. Talk to your pediatrician regarding the use of this medicine in children. Special care may be needed. Overdosage: If you think you have taken too much of this medicine contact a poison control center or emergency room at once. NOTE: This medicine is only for you. Do not share this medicine with others. What if I miss a dose? If you miss a dose, take it as soon as you can. If it is almost time for next dose, take only that dose. Do not take double or extra doses. What may interact with this medicine? Do not take this medicine with any of the following medicines: -aliskiren if you have diabetes -angiotensin-converting enzyme (ACE) inhibitors, like benazepril, captopril, enalapril, fosinopril, lisinopril, or ramipril This medicine may also interact with the following medicines: -angiotensin II receptor blockers (ARBs) like azilsartan, candesartan, eprosartan, irbesartan, losartan,  olmesartan, telmisartan, or valsartan -lithium -NSAIDS, medicines for pain and inflammation, like ibuprofen or naproxen -potassium-sparing diuretics like amiloride, spironolactone, and triamterene -potassium supplements This list may not describe all possible interactions. Give your health care provider a list of all the medicines, herbs, non-prescription drugs, or dietary supplements you use. Also tell them if you smoke, drink alcohol, or use illegal drugs. Some items may interact with your medicine. What should I watch for while using this medicine? Tell your doctor or healthcare professional if your symptoms do not start to get better or if they get worse. Do not become pregnant while taking this medicine. Women should inform their doctor if they wish to become pregnant or think they might be pregnant. There is a potential for serious side effects to an unborn child. Talk to your health care professional or pharmacist for more information. You may get dizzy. Do not drive, use machinery, or do anything  that needs mental alertness until you know how this medicine affects you. Do not stand or sit up quickly, especially if you are an older patient. This reduces the risk of dizzy or fainting spells. Avoid alcoholic drinks; they can make you more dizzy. What side effects may I notice from receiving this medicine? Side effects that you should report to your doctor or health care professional as soon as possible: -allergic reactions like skin rash, itching or hives, swelling of the face, lips, or tongue -signs and symptoms of increased potassium like muscle weakness; chest pain; or fast, irregular heartbeat -signs and symptoms of kidney injury like trouble passing urine or change in the amount of urine -signs and symptoms of low blood pressure like feeling dizzy or lightheaded, or if you develop extreme fatigue Side effects that usually do not require medical attention (report to your doctor or health  care professional if they continue or are bothersome): -cough This list may not describe all possible side effects. Call your doctor for medical advice about side effects. You may report side effects to FDA at 1-800-FDA-1088. Where should I keep my medicine? Keep out of the reach of children. Store at room temperature between 15 and 30 degrees C (59 and 86 degrees F). Throw away any unused medicine after the expiration date. NOTE: This sheet is a summary. It may not cover all possible information. If you have questions about this medicine, talk to your doctor, pharmacist, or health care provider.  2018 Elsevier/Gold Standard (2015-04-03 13:54:19)    Omeprazole capsules (sprinkle caps) - Rx What is this medicine? OMEPRAZOLE (oh ME pray zol) prevents the production of acid in the stomach. It is used to treat gastroesophageal reflux disease (GERD), ulcers, certain bacteria in the stomach, inflammation of the esophagus, and Zollinger-Ellison Syndrome. It is also used to treat other conditions that cause too much stomach acid. This medicine may be used for other purposes; ask your health care provider or pharmacist if you have questions. COMMON BRAND NAME(S): Prilosec What should I tell my health care provider before I take this medicine? They need to know if you have any of these conditions: -liver disease -low levels of magnesium in the blood -lupus -an unusual or allergic reaction to omeprazole, other medicines, foods, dyes, or preservatives -pregnant or trying to get pregnant -breast-feeding How should I use this medicine? Take this medicine by mouth with a glass of water. Follow the directions on the prescription label. Do not crush, break or chew the capsules. They can be opened and the contents sprinkled on a small amount of applesauce or yogurt, given with fruit juices, or swallowed immediately with water. This medicine works best if taken on an empty stomach 30 to 60 minutes before  breakfast. Take your doses at regular intervals. Do not take your medicine more often than directed. Talk to your pediatrician regarding the use of this medicine in children. Special care may be needed. Overdosage: If you think you have taken too much of this medicine contact a poison control center or emergency room at once. NOTE: This medicine is only for you. Do not share this medicine with others. What if I miss a dose? If you miss a dose, take it as soon as you can. If it is almost time for your next dose, take only that dose. Do not take double or extra doses. What may interact with this medicine? Do not take this medicine with any of the following medications: -atazanavir -clopidogrel -nelfinavir This medicine  may also interact with the following medications: -ampicillin -certain medicines for anxiety or sleep -certain medicines that treat or prevent blood clots like warfarin -cyclosporine -diazepam -digoxin -disulfiram -diuretics -iron salts -methotrexate -mycophenolate mofetil -phenytoin -prescription medicine for fungal or yeast infection like itraconazole, ketoconazole, voriconazole -saquinavir -tacrolimus This list may not describe all possible interactions. Give your health care provider a list of all the medicines, herbs, non-prescription drugs, or dietary supplements you use. Also tell them if you smoke, drink alcohol, or use illegal drugs. Some items may interact with your medicine. What should I watch for while using this medicine? It can take several days before your stomach pain gets better. Check with your doctor or health care professional if your condition does not start to get better, or if it gets worse. You may need blood work done while you are taking this medicine. What side effects may I notice from receiving this medicine? Side effects that you should report to your doctor or health care professional as soon as possible: -allergic reactions like skin  rash, itching or hives, swelling of the face, lips, or tongue -bone, muscle or joint pain -breathing problems -chest pain or chest tightness -dark yellow or brown urine -dizziness -fast, irregular heartbeat -feeling faint or lightheaded -fever or sore throat -muscle spasm -palpitations -rash on cheeks or arms that gets worse in the sun -redness, blistering, peeling or loosening of the skin, including inside the mouth -seizures -tremors -unusual bleeding or bruising -unusually weak or tired -yellowing of the eyes or skin Side effects that usually do not require medical attention (report to your doctor or health care professional if they continue or are bothersome): -constipation -diarrhea -dry mouth -headache -nausea This list may not describe all possible side effects. Call your doctor for medical advice about side effects. You may report side effects to FDA at 1-800-FDA-1088. Where should I keep my medicine? Keep out of the reach of children. Store at room temperature between 15 and 30 degrees C (59 and 86 degrees F). Protect from light and moisture. Throw away any unused medicine after the expiration date. NOTE: This sheet is a summary. It may not cover all possible information. If you have questions about this medicine, talk to your doctor, pharmacist, or health care provider.  2018 Elsevier/Gold Standard (2015-03-21 12:18:47)

## 2018-01-20 LAB — BASIC METABOLIC PANEL
BUN/Creatinine Ratio: 15 (ref 9–23)
BUN: 10 mg/dL (ref 6–24)
CO2: 23 mmol/L (ref 20–29)
CREATININE: 0.65 mg/dL (ref 0.57–1.00)
Calcium: 9.4 mg/dL (ref 8.7–10.2)
Chloride: 101 mmol/L (ref 96–106)
GFR calc non Af Amer: 104 mL/min/{1.73_m2} (ref >59)
GFR, EST AFRICAN AMERICAN: 120 mL/min/{1.73_m2} (ref >59)
Glucose: 89 mg/dL (ref 65–99)
Potassium: 3.9 mmol/L (ref 3.5–5.2)
SODIUM: 139 mmol/L (ref 134–144)

## 2018-01-20 LAB — PRO B NATRIURETIC PEPTIDE: NT-Pro BNP: 860 pg/mL — ABNORMAL HIGH (ref 0–249)

## 2018-01-26 ENCOUNTER — Encounter: Payer: Self-pay | Admitting: *Deleted

## 2018-01-26 ENCOUNTER — Other Ambulatory Visit: Payer: Self-pay | Admitting: *Deleted

## 2018-01-26 DIAGNOSIS — I5042 Chronic combined systolic (congestive) and diastolic (congestive) heart failure: Secondary | ICD-10-CM | POA: Insufficient documentation

## 2018-01-26 HISTORY — DX: Chronic combined systolic (congestive) and diastolic (congestive) heart failure: I50.42

## 2018-01-26 MED ORDER — SACUBITRIL-VALSARTAN 24-26 MG PO TABS: 1.0000 | ORAL_TABLET | Freq: Two times a day (BID) | ORAL | 3 refills | Status: DC

## 2018-01-28 ENCOUNTER — Ambulatory Visit (HOSPITAL_BASED_OUTPATIENT_CLINIC_OR_DEPARTMENT_OTHER)
Admission: RE | Admit: 2018-01-28 | Discharge: 2018-01-28 | Disposition: A | Discharge status: Home / Self Care | Payer: Medicaid Other | Source: Ambulatory Visit | Attending: Cardiology | Admitting: Cardiology

## 2018-01-28 DIAGNOSIS — I255 Ischemic cardiomyopathy: Secondary | ICD-10-CM | POA: Insufficient documentation

## 2018-01-28 DIAGNOSIS — I2510 Atherosclerotic heart disease of native coronary artery without angina pectoris: Secondary | ICD-10-CM | POA: Diagnosis not present

## 2018-01-28 NOTE — Progress Notes (Signed)
  Echocardiogram 2D Echocardiogram has been performed.  Georga Stys T Na Waldrip 01/28/2018, 10:35 AM

## 2018-02-02 ENCOUNTER — Telehealth: Payer: Self-pay

## 2018-02-02 MED ORDER — TICAGRELOR 90 MG PO TABS: 90.0000 mg | ORAL_TABLET | Freq: Two times a day (BID) | ORAL | 3 refills | Status: DC

## 2018-02-02 NOTE — Telephone Encounter (Signed)
Patient walked into office asking for paper work to be completed for Brilinta.  Paper work has been completed and left at front desk for pick up.

## 2018-02-08 ENCOUNTER — Telehealth: Payer: Self-pay

## 2018-02-08 MED ORDER — TICAGRELOR 90 MG PO TABS: 90.0000 mg | ORAL_TABLET | Freq: Two times a day (BID) | ORAL | 3 refills | Status: DC

## 2018-02-08 NOTE — Telephone Encounter (Signed)
Rx for Brilinta 90mg  one tablet twice daily printed for [employer] patient assistance paper work.

## 2018-02-24 ENCOUNTER — Telehealth: Payer: Self-pay | Admitting: Cardiology

## 2018-02-25 NOTE — Telephone Encounter (Signed)
Left message to return call 

## 2018-02-25 NOTE — Telephone Encounter (Signed)
Patient returned your call, please call back at [phone]

## 2018-02-28 NOTE — Telephone Encounter (Signed)
Patient reports that she finally received her entresto in the mail today after contacting the company. Patient states that she is still waiting on a decision about patient assistance about brilinta and is in need of samples. Samples have been reserved for [place] to pick [place] in the [place] office by Retia PasseAmanda Hunt, CMA. Patient verbalized understanding. No further questions.

## 2018-03-10 NOTE — Progress Notes (Signed)
Cardiology Office Note:    Date:  03/11/2018   ID:  Gwinda Maine, DOB 14-Jan-1966, MRN [id number]  PCP:  Greig Right, MD  Cardiologist:  Shirlee More, MD    Referring MD: Greig Right, MD    ASSESSMENT:    1. CAD in native artery   2. Pure hypercholesterolemia   3. SOB (shortness of breath)   4. Hypertensive heart disease, unspecified whether heart failure present    PLAN:    In order of problems listed above:  1. Stable CAD continue current treatment including dual antiplatelet lipid-lowering therapy.  At this time she is having no angina and fortunately she has no demonstrable aneurysm thrombus and ejection fraction had improved 2. Recheck lipid profile she may require a second agent or [id number] continue high intensity statin 3. Poorly controlled hypertension increase the dose of beta-blocker increase her Entresto and add a minimum dose of a diuretic 4. Shortness of breath at risk for symptoms of heart failure check proBNP place her on a minimum dose of diuretic Lasix 20 mg once a week and I did ask her to sodium restrict if her BMP is significant elevation need to take diuretic on a regular basis. 5. I strongly encouraged her to continue smoking cessation   Next appointment: 3 months   Medication Adjustments/Labs and Tests Ordered: Current medicines are reviewed at length with the patient today.  Concerns regarding medicines are outlined above.  Orders Placed This Encounter  Procedures  . Comp Met (CMET)  . Lipid Profile  . Pro b natriuretic peptide (BNP)   Meds ordered this encounter  Medications  . carvedilol (COREG) 12.5 MG tablet    Sig: Take 1 tablet (12.5 mg total) by mouth 2 (two) times daily with a meal.    Dispense:  60 tablet    Refill:  3  . DISCONTD: sacubitril-valsartan (ENTRESTO) 49-51 MG    Sig: Take 1 tablet by mouth 2 (two) times daily.    Dispense:  60 tablet    Refill:  3  . DISCONTD: furosemide (LASIX) 20 MG tablet    Sig: Take 1 tablet (20  mg total) by mouth daily.    Dispense:  30 tablet    Refill:  3  . furosemide (LASIX) 20 MG tablet    Sig: Take 1 tablet (20 mg total) by mouth once a week.    Dispense:  30 tablet    Refill:  1    Chief Complaint  Patient presents with  . Follow-up  . Coronary Artery Disease  . Shortness of Breath    History of Present Illness:    Melanie Johnson is a 53 y.o. female with a hx of  anterior STEMI with admission [facility] 01/13/2018 at which time she had PCI and stenting of her left anterior descending coronary artery.  Ejection fraction was 35 to 40% with dyskinesia of the mid and apical anterior septal anterior and apical myocardium.  Repeat echocardiogram performed 01/28/2018 with concerns about ventricular aneurysm thrombus shows an ejection fraction of 55 to 60% with hypokinesia of the mid and apical anteroseptal and apical anterior myocardium there is also focal apical akinesia and no thrombus. She was last seen 01/19/2018. Compliance with diet, lifestyle and medications: Yes  Her concern is her blood pressure which is often over in the range of 782 systolic she is a little apprehensive about her prognosis and is reassured by the findings of her echocardiogram.  She not having angina but she finds herself vaguely  short of breath at times.  No edema orthopnea angina palpitation or syncope fortunately she is not smoking.  Unfortunately she is liberally add salt to her diet. Past Medical History:  Diagnosis Date  . Hypertension   . Thyroid disease     Past Surgical History:  Procedure Laterality Date  . CORONARY/GRAFT ACUTE MI REVASCULARIZATION N/A 01/10/2018   Procedure: Coronary/Graft Acute MI Revascularization;  Surgeon: Sherren Mocha, MD;  Location: [place] [facility];  Service: Cardiovascular;  Laterality: N/A;  . LEFT HEART CATH AND CORONARY ANGIOGRAPHY N/A 01/10/2018   Procedure: LEFT HEART CATH AND CORONARY ANGIOGRAPHY;  Surgeon: Sherren Mocha, MD;  Location: [place] [facility];  Service: Cardiovascular;  Laterality: N/A;    Current Medications: Current Meds  Medication Sig  . aspirin EC 81 MG tablet Take 81 mg by mouth daily.  Marland Kitchen atorvastatin (LIPITOR) 80 MG tablet Take 1 tablet (80 mg total) by mouth daily.  . carvedilol (COREG) 12.5 MG tablet Take 1 tablet (12.5 mg total) by mouth 2 (two) times daily with a meal.  . levothyroxine (SYNTHROID, LEVOTHROID) 75 MCG tablet Take 75 mcg by mouth daily before breakfast.  . nitroGLYCERIN (NITROSTAT) 0.4 MG SL tablet Place 1 tablet (0.4 mg total) under the tongue every 5 (five) minutes x 3 doses as needed for chest pain.  Marland Kitchen omeprazole (PRILOSEC) 20 MG capsule Take 1 capsule (20 mg total) by mouth daily.  . ticagrelor (BRILINTA) 90 MG TABS tablet Take 1 tablet (90 mg total) by mouth 2 (two) times daily.  . [DISCONTINUED] carvedilol (COREG) 6.25 MG tablet Take 1 tablet (6.25 mg total) by mouth 2 (two) times daily with a meal.  . [DISCONTINUED] sacubitril-valsartan (ENTRESTO) 24-26 MG Take 1 tablet by mouth 2 (two) times daily.     Allergies:   Penicillins   Social History   Socioeconomic History  . Marital status: Married    Spouse name: Not on file  . Number of children: Not on file  . Years of education: Not on file  . Highest education level: Not on file  Occupational History  . Not on file  Social Needs  . Financial resource strain: Not hard at all  . Food insecurity:    Worry: Never true    Inability: Never true  . Transportation needs:    Medical: No    Non-medical: No  Tobacco Use  . Smoking status: Former Smoker    Packs/day: 2.00    Years: 25.00    Pack years: 50.00    Types: Cigarettes    Last attempt to quit: 01/11/2018    Years since quitting: 0.1  . Smokeless tobacco: Never Used  Substance and Sexual Activity  . Alcohol use: No    Frequency: Never  . Drug use: No  . Sexual activity: Not on file  Lifestyle  . Physical activity:    Days per week: 2 days    Minutes per  session: 20 min  . Stress: Not at all  Relationships  . Social connections:    Talks on phone: More than three times a week    Gets together: More than three times a week    Attends religious service: 1 to 4 times per year    Active member of club or organization: Yes    Attends meetings of clubs or organizations: 1 to 4 times per year    Relationship status: Married  Other Topics Concern  . Not on file  Social History Narrative  .  Not on file     Family History: The patient's family history includes CAD in her father; Heart attack in her father; Heart disease in her father. ROS:   Please see the history of present illness.    All other systems reviewed and are negative.  EKGs/Labs/Other Studies Reviewed:    The following studies were reviewed today:    Recent Labs: 01/11/2018: B Natriuretic Peptide 74.6; Hemoglobin 14.6; Hemoglobin 14.7; Magnesium 1.9; Platelets 225; Platelets 209; TSH 3.607 01/19/2018: BUN 10; Creatinine, Ser 0.65; NT-Pro BNP 860; Potassium 3.9; Sodium 139  Recent Lipid Panel    Component Value Date/Time   CHOL 207 (H) 01/11/2018 0342   TRIG 97 01/11/2018 0342   HDL 45 01/11/2018 0342   CHOLHDL 4.6 01/11/2018 0342   VLDL 19 01/11/2018 0342   LDLCALC 143 (H) 01/11/2018 0342    Physical Exam:    VS:  BP (!) 160/84 (BP Location: Right Arm, Patient Position: Sitting, Cuff Size: Normal)   Pulse 78   Ht 4' 10.75" (1.492 m)   Wt 127 lb 4 oz (57.7 kg)   SpO2 99%   BMI 25.92 kg/m     Wt Readings from Last 3 Encounters:  03/11/18 127 lb 4 oz (57.7 kg)  01/19/18 125 lb (56.7 kg)  01/13/18 123 lb 9.6 oz (56.1 kg)     GEN:  Well nourished, well developed in no acute distress HEENT: Normal NECK: No JVD; No carotid bruits LYMPHATICS: No lymphadenopathy CARDIAC: RRR, no murmurs, rubs, gallops RESPIRATORY:  Clear to auscultation without rales, wheezing or rhonchi  ABDOMEN: Soft, non-tender, non-distended MUSCULOSKELETAL: Trace ankle bilateral edema;  No deformity  SKIN: Warm and dry NEUROLOGIC:  Alert and oriented x 3 PSYCHIATRIC:  Normal affect    Signed, Shirlee More, MD  03/11/2018 10:35 AM    [place]

## 2018-03-11 ENCOUNTER — Other Ambulatory Visit: Payer: Self-pay | Admitting: *Deleted

## 2018-03-11 ENCOUNTER — Ambulatory Visit: Payer: BLUE CROSS/BLUE SHIELD | Admitting: Cardiology

## 2018-03-11 ENCOUNTER — Encounter: Payer: Self-pay | Admitting: Cardiology

## 2018-03-11 VITALS — BP 160/84 | HR 78 | Ht 58.75 in | Wt 127.2 lb

## 2018-03-11 DIAGNOSIS — E7800 Pure hypercholesterolemia, unspecified: Secondary | ICD-10-CM

## 2018-03-11 DIAGNOSIS — I119 Hypertensive heart disease without heart failure: Secondary | ICD-10-CM | POA: Insufficient documentation

## 2018-03-11 DIAGNOSIS — R0602 Shortness of breath: Secondary | ICD-10-CM | POA: Insufficient documentation

## 2018-03-11 DIAGNOSIS — I2510 Atherosclerotic heart disease of native coronary artery without angina pectoris: Secondary | ICD-10-CM

## 2018-03-11 HISTORY — DX: Hypertensive heart disease without heart failure: I11.9

## 2018-03-11 HISTORY — DX: Shortness of breath: R06.02

## 2018-03-11 LAB — COMPREHENSIVE METABOLIC PANEL
ALT: 21 IU/L (ref 0–32)
AST: 17 IU/L (ref 0–40)
Albumin/Globulin Ratio: 1.6 (ref 1.2–2.2)
Albumin: 4.1 g/dL (ref 3.5–5.5)
Alkaline Phosphatase: 112 IU/L (ref 39–117)
BUN/Creatinine Ratio: 14 (ref 9–23)
BUN: 8 mg/dL (ref 6–24)
Bilirubin Total: 1 mg/dL (ref 0.0–1.2)
CO2: 23 mmol/L (ref 20–29)
Calcium: 9.3 mg/dL (ref 8.7–10.2)
Chloride: 103 mmol/L (ref 96–106)
Creatinine, Ser: 0.59 mg/dL (ref 0.57–1.00)
GFR calc Af Amer: 124 mL/min/{1.73_m2} (ref >59)
GFR calc non Af Amer: 107 mL/min/{1.73_m2} (ref >59)
Globulin, Total: 2.6 g/dL (ref 1.5–4.5)
Glucose: 80 mg/dL (ref 65–99)
Potassium: 3.8 mmol/L (ref 3.5–5.2)
Sodium: 139 mmol/L (ref 134–144)
Total Protein: 6.7 g/dL (ref 6.0–8.5)

## 2018-03-11 LAB — PRO B NATRIURETIC PEPTIDE: NT-PRO BNP: 508 pg/mL — AB (ref 0–249)

## 2018-03-11 LAB — LIPID PANEL
Chol/HDL Ratio: 2.5 ratio (ref 0.0–4.4)
Cholesterol, Total: 122 mg/dL (ref 100–199)
HDL: 48 mg/dL (ref >39)
LDL Calculated: 55 mg/dL (ref 0–99)
Triglycerides: 96 mg/dL (ref 0–149)
VLDL Cholesterol Cal: 19 mg/dL (ref 5–40)

## 2018-03-11 MED ORDER — CARVEDILOL 12.5 MG PO TABS: 12.5000 mg | ORAL_TABLET | Freq: Two times a day (BID) | ORAL | 3 refills | Status: DC

## 2018-03-11 MED ORDER — FUROSEMIDE 20 MG PO TABS: 20.0000 mg | ORAL_TABLET | ORAL | 1 refills | Status: DC

## 2018-03-11 MED ORDER — FUROSEMIDE 20 MG PO TABS: 20.0000 mg | ORAL_TABLET | Freq: Every day | ORAL | 3 refills | Status: DC

## 2018-03-11 MED ORDER — SACUBITRIL-VALSARTAN 49-51 MG PO TABS: 1.0000 | ORAL_TABLET | Freq: Two times a day (BID) | ORAL | 3 refills | Status: DC

## 2018-03-11 NOTE — Patient Instructions (Addendum)
Medication Instructions:  Your physician has recommended you make the following change in your medication:   INCREASE carvedilol (coreg) 12.5 mg: take 1 tablet twice daily INCREASE sacubitril-valsartan (entresto) 49-51 mg: Take 1 tablet twice daily  START furosemide (lasix) 20 mg: Take 1 tablet once a week  If you need a refill on your cardiac medications before your next appointment, please call your pharmacy.   Lab work: Your physician recommends that you return for lab work today: CMP, lipid panel, ProBNP.   If you have labs (blood work) drawn today and your tests are completely normal, you will receive your results only by: Marland Kitchen MyChart Message (if you have MyChart) OR . A paper copy in the mail If you have any lab test that is abnormal or we need to change your treatment, we will call you to review the results.  Testing/Procedures: None  Follow-Up: At [facility], you and your health needs are our priority.  As part of our continuing mission to provide you with exceptional heart care, we have created designated Provider Care Teams.  These Care Teams include your primary Cardiologist (physician) and Advanced Practice Providers (APPs -  Physician Assistants and Nurse Practitioners) who all work together to provide you with the care you need, when you need it. You will need a follow up appointment in 3 months.  Please call our office 2 months in advance to schedule this appointment.        Coping with Quitting Smoking  Quitting smoking is a physical and mental challenge. You will face cravings, withdrawal symptoms, and temptation. Before quitting, work with your health care provider to make a plan that can help you cope. Preparation can help you quit and keep you from giving in. How can I cope with cravings? Cravings usually last for 5-10 minutes. If you get through it, the craving will pass. Consider taking the following actions to help you cope with cravings:  Keep your mouth  busy: ? Chew sugar-free gum. ? Suck on hard candies or a straw. ? Brush your teeth.  Keep your hands and body busy: ? Immediately change to a different activity when you feel a craving. ? Squeeze or play with a ball. ? Do an activity or a hobby, like making bead jewelry, practicing needlepoint, or working with wood. ? Mix up your normal routine. ? Take a short exercise break. Go for a quick walk or run up and down stairs. ? Spend time in public places where smoking is not allowed.  Focus on doing something kind or helpful for someone else.  Call a friend or family member to talk during a craving.  Join a support group.  Call a quit line, such as 1-800-QUIT-NOW.  Talk with your health care provider about medicines that might help you cope with cravings and make quitting easier for you. How can I deal with withdrawal symptoms? Your body may experience negative effects as it tries to get used to not having nicotine in the system. These effects are called withdrawal symptoms. They may include:  Feeling hungrier than normal.  Trouble concentrating.  Irritability.  Trouble sleeping.  Feeling depressed.  Restlessness and agitation.  Craving a cigarette. To manage withdrawal symptoms:  Avoid places, people, and activities that trigger your cravings.  Remember why you want to quit.  Get plenty of sleep.  Avoid coffee and other caffeinated drinks. These may worsen some of your symptoms. How can I handle social situations? Social situations can be difficult when  you are quitting smoking, especially in the first few weeks. To manage this, you can:  Avoid parties, bars, and other social situations where people might be smoking.  Avoid alcohol.  Leave right away if you have the urge to smoke.  Explain to your family and friends that you are quitting smoking. Ask for understanding and support.  Plan activities with friends or family where smoking is not an option. What are  some ways I can cope with stress? Wanting to smoke may cause stress, and stress can make you want to smoke. Find ways to manage your stress. Relaxation techniques can help. For example:  Breathe slowly and deeply, in through your nose and out through your mouth.  Listen to soothing, relaxing music.  Talk with a family member or friend about your stress.  Light a candle.  Soak in a bath or take a shower.  Think about a peaceful place. What are some ways I can prevent weight gain? Be aware that many people gain weight after they quit smoking. However, not everyone does. To keep from gaining weight, have a plan in place before you quit and stick to the plan after you quit. Your plan should include:  Having healthy snacks. When you have a craving, it may help to: ? Eat plain popcorn, crunchy carrots, celery, or other cut vegetables. ? Chew sugar-free gum.  Changing how you eat: ? Eat small portion sizes at meals. ? Eat 4-6 small meals throughout the day instead of 1-2 large meals a day. ? Be mindful when you eat. Do not watch television or do other things that might distract you as you eat.  Exercising regularly: ? Make time to exercise each day. If you do not have time for a long workout, do short bouts of exercise for 5-10 minutes several times a day. ? Do some form of strengthening exercise, like weight lifting, and some form of aerobic exercise, like running or swimming.  Drinking plenty of water or other low-calorie or no-calorie drinks. Drink 6-8 glasses of water daily, or as much as instructed by your health care provider. Summary  Quitting smoking is a physical and mental challenge. You will face cravings, withdrawal symptoms, and temptation to smoke again. Preparation can help you as you go through these challenges.  You can cope with cravings by keeping your mouth busy (such as by chewing gum), keeping your body and hands busy, and making calls to family, friends, or a  helpline for people who want to quit smoking.  You can cope with withdrawal symptoms by avoiding places where people smoke, avoiding drinks with caffeine, and getting plenty of rest.  Ask your health care provider about the different ways to prevent weight gain, avoid stress, and handle social situations. This information is not intended to replace advice given to you by your health care provider. Make sure you discuss any questions you have with your health care provider. Document Released: 02/14/2016 Document Revised: 02/14/2016 Document Reviewed: 02/14/2016 Elsevier Interactive Patient Education  2019 Elsevier Inc.     Furosemide tablets What is this medicine? FUROSEMIDE (fyoor [place] se mide) is a diuretic. It helps you make more urine and to lose salt and excess water from your body. This medicine is used to treat high blood pressure, and edema or swelling from heart, kidney, or liver disease. This medicine may be used for other purposes; ask your health care provider or pharmacist if you have questions. COMMON BRAND NAME(S): Active-Medicated Specimen Kit, Delone, Diuscreen,  Lasix, RX Specimen Collection Kit, Specimen Collection Kit, URINX Medicated Specimen Collection What should I tell my health care provider before I take this medicine? They need to know if you have any of these conditions: -abnormal blood electrolytes -diarrhea or vomiting -gout -heart disease -kidney disease, small amounts of urine, or difficulty passing urine -liver disease -thyroid disease -an unusual or allergic reaction to furosemide, sulfa drugs, other medicines, foods, dyes, or preservatives -pregnant or trying to get pregnant -breast-feeding How should I use this medicine? Take this medicine by mouth with a glass of water. Follow the directions on the prescription label. You may take this medicine with or without food. If it upsets your stomach, take it with food or milk. Do not take your medicine more  often than directed. Remember that you will need to pass more urine after taking this medicine. Do not take your medicine at a time of day that will cause you problems. Do not take at bedtime. Talk to your pediatrician regarding the use of this medicine in children. While this drug may be prescribed for selected conditions, precautions do apply. Overdosage: If you think you have taken too much of this medicine contact a poison control center or emergency room at once. NOTE: This medicine is only for you. Do not share this medicine with others. What if I miss a dose? If you miss a dose, take it as soon as you can. If it is almost time for your next dose, take only that dose. Do not take double or extra doses. What may interact with this medicine? -aspirin and aspirin-like medicines -certain antibiotics -chloral hydrate -cisplatin -cyclosporine -digoxin -diuretics -laxatives -lithium -medicines for blood pressure -medicines that relax muscles for surgery -methotrexate -NSAIDs, medicines for pain and inflammation like ibuprofen, naproxen, or indomethacin -phenytoin -steroid medicines like prednisone or cortisone -sucralfate -thyroid hormones This list may not describe all possible interactions. Give your health care provider a list of all the medicines, herbs, non-prescription drugs, or dietary supplements you use. Also tell them if you smoke, drink alcohol, or use illegal drugs. Some items may interact with your medicine. What should I watch for while using this medicine? Visit your doctor or health care professional for regular checks on your progress. Check your blood pressure regularly. Ask your doctor or health care professional what your blood pressure should be, and when you should contact him or her. If you are a diabetic, check your blood sugar as directed. You may need to be on a special diet while taking this medicine. Check with your doctor. Also, ask how many glasses of fluid you  need to drink a day. You must not get dehydrated. You may get drowsy or dizzy. Do not drive, use machinery, or do anything that needs mental alertness until you know how this drug affects you. Do not stand or sit up quickly, especially if you are an older patient. This reduces the risk of dizzy or fainting spells. Alcohol can make you more drowsy and dizzy. Avoid alcoholic drinks. This medicine can make you more sensitive to the sun. Keep out of the sun. If you cannot avoid being in the sun, wear protective clothing and use sunscreen. Do not use sun lamps or tanning beds/booths. What side effects may I notice from receiving this medicine? Side effects that you should report to your doctor or health care professional as soon as possible: -blood in urine or stools -dry mouth -fever or chills -hearing loss or ringing in the ears -irregular  heartbeat -muscle pain or weakness, cramps -skin rash -stomach upset, pain, or nausea -tingling or numbness in the hands or feet -unusually weak or tired -vomiting or diarrhea -yellowing of the eyes or skin Side effects that usually do not require medical attention (report to your doctor or health care professional if they continue or are bothersome): -headache -loss of appetite -unusual bleeding or bruising This list may not describe all possible side effects. Call your doctor for medical advice about side effects. You may report side effects to FDA at 1-800-FDA-1088. Where should I keep my medicine? Keep out of the reach of children. Store at room temperature between 15 and 30 degrees C (59 and 86 degrees F). Protect from light. Throw away any unused medicine after the expiration date. NOTE: This sheet is a summary. It may not cover all possible information. If you have questions about this medicine, talk to your doctor, pharmacist, or health care provider.  2019 Elsevier/Gold Standard (2014-05-09 13:49:50)

## 2018-06-02 ENCOUNTER — Telehealth: Payer: Self-pay | Admitting: Cardiology

## 2018-06-02 NOTE — Telephone Encounter (Signed)
Called patient and lm to call office for poss televist.

## 2018-06-03 ENCOUNTER — Telehealth: Payer: Self-pay | Admitting: Cardiology

## 2018-06-03 NOTE — Telephone Encounter (Signed)
Cardiac Questionnaire:    Since your last visit or hospitalization:    1. Have you been having new or worsening chest pain? no   2. Have you been having new or worsening shortness of breath?no 3. Have you been having new or worsening leg swelling, wt gain, or increase in abdominal girth (pants fitting more tightly)? no   4. Have you had any passing out spells? no    *A YES to any of these questions would result in the appointment being kept. *If all the answers to these questions are NO, we should indicate that given the current situation regarding the worldwide coronarvirus pandemic, at the recommendation of the CDC, we are looking to limit gatherings in our waiting area, and thus will reschedule their appointment beyond four weeks from today.   _____________   TDVVO-16 Pre-Screening Questions:   Do you currently have a fever? no  Have you recently travelled on a cruise, internationally, or to [place], [place], [place], or [place], [place]) ? no  Have you been in contact with someone that is currently pending confirmation of Covid19 testing or has been confirmed to have the [place] virus?  no  Are you currently experiencing fatigue or cough? No  YOUR CARDIOLOGY TEAM HAS ARRANGED FOR AN E-VISIT FOR YOUR APPOINTMENT - PLEASE REVIEW IMPORTANT INFORMATION BELOW SEVERAL DAYS PRIOR TO YOUR APPOINTMENT  Due to the recent COVID-19 pandemic, we are transitioning in-person office visits to tele-medicine visits in an effort to decrease unnecessary exposure to our patients and staff. Medicare and most insurances are covering these visits without a copay needed. We also encourage you to sign up for MyChart if you have not already done so. You will need a smartphone if possible. For patients that do not have this, we can still complete the visit using a regular telephone but do prefer a smartphone to enable video when possible. You may have a close family member that lives with you that can  help. If possible, we also ask that you have a blood pressure cuff and scale at home to measure your blood pressure, heart rate and weight prior to your scheduled appointment. Patients with clinical needs that need an in-person evaluation and testing will still be able to come to the office if absolutely necessary. If you have any questions, feel free to call our office.  CONSENT FOR TELE-HEALTH VISIT - PLEASE REVIEW  I hereby voluntarily request, consent and authorize [facility] and its employed or contracted physicians, physician assistants, nurse practitioners or other licensed health care professionals (the Practitioner), to provide me with telemedicine health care services (the Services") as deemed necessary by the treating Practitioner. I acknowledge and consent to receive the Services by the Practitioner via telemedicine. I understand that the telemedicine visit will involve communicating with the Practitioner through live audiovisual communication technology and the disclosure of certain medical information by electronic transmission. I acknowledge that I have been given the opportunity to request an in-person assessment or other available alternative prior to the telemedicine visit and am voluntarily participating in the telemedicine visit.  I understand that I have the right to withhold or withdraw my consent to the use of telemedicine in the course of my care at any time, without affecting my right to future care or treatment, and that the Practitioner or I may terminate the telemedicine visit at any time. I understand that I have the right to inspect all information obtained and/or recorded in the course of the  telemedicine visit and may receive copies of available information for a reasonable fee.  I understand that some of the potential risks of receiving the Services via telemedicine include:   Delay or interruption in medical evaluation due to technological equipment failure or  disruption;  Information transmitted may not be sufficient (e.g. poor resolution of images) to allow for appropriate medical decision making by the Practitioner; and/or   In rare instances, security protocols could fail, causing a breach of personal health information.  Furthermore, I acknowledge that it is my responsibility to provide information about my medical history, conditions and care that is complete and accurate to the best of my ability. I acknowledge that Practitioner's advice, recommendations, and/or decision may be based on factors not within their control, such as incomplete or inaccurate data provided by me or distortions of diagnostic images or specimens that may result from electronic transmissions. I understand that the practice of medicine is not an exact science and that Practitioner makes no warranties or guarantees regarding treatment outcomes. I acknowledge that I will receive a copy of this consent concurrently upon execution via email to the email address I last provided but may also request a printed copy by calling the office of [facility].    I understand that my insurance will be billed for this visit.   I have read or had this consent read to me.  I understand the contents of this consent, which adequately explains the benefits and risks of the Services being provided via telemedicine.   I have been provided ample opportunity to ask questions regarding this consent and the Services and have had my questions answered to my satisfaction.  I give my informed consent for the services to be provided through the use of telemedicine in my medical care  By participating in this telemedicine visit I agree to the above.  Patient gives verbal consent for televisit 06/03/2018 pp

## 2018-06-08 ENCOUNTER — Telehealth: Payer: Self-pay | Admitting: Cardiology

## 2018-06-08 NOTE — Telephone Encounter (Signed)
Cardiac Questionnaire:    Since your last visit or hospitalization:    1. Have you been having new or worsening chest pain? no   2. Have you been having new or worsening shortness of breath? no 3. Have you been having new or worsening leg swelling, wt gain, or increase in abdominal girth (pants fitting more tightly)? no   4. Have you had any passing out spells? no    *A YES to any of these questions would result in the appointment being kept. *If all the answers to these questions are NO, we should indicate that given the current situation regarding the worldwide coronarvirus pandemic, at the recommendation of the CDC, we are looking to limit gatherings in our waiting area, and thus will reschedule their appointment beyond four weeks from today.   _____________   COVID-19 Pre-Screening Questions:   Do you currently have a fever? no (yes = cancel and refer to pcp for e-visit)  Have you recently travelled on a cruise, internationally, or to [place], [place], [place], [place], [place], or [place], [place]) ? no (yes = cancel, stay home, monitor symptoms, and contact pcp or initiate e-visit if symptoms develop)  Have you been in contact with someone that is currently pending confirmation of Covid19 testing or has been confirmed to have the Covid19 virus?  no (yes = cancel, stay home, away from tested individual, monitor symptoms, and contact pcp or initiate e-visit if symptoms develop)  Are you currently experiencing fatigue or cough? no (yes = pt should be prepared to have a mask placed at the time of their visit).      Virtual Visit Pre-Appointment Phone Call  Steps For Call:  1. Confirm consent - "In the setting of the current Covid19 crisis, you are scheduled for a (phone or video) visit with your provider on (date) at (time).  Just as we do with many in-office visits, in order for you to participate in this visit, we must obtain consent.  If you'd like, I can send this to your mychart (if  signed up) or email for you to review.  Otherwise, I can obtain your verbal consent now.  All virtual visits are billed to your insurance company just like a normal visit would be.  By agreeing to a virtual visit, we'd like you to understand that the technology does not allow for your provider to perform an examination, and thus may limit your provider's ability to fully assess your condition.  Finally, though the technology is pretty good, we cannot assure that it will always work on either your or our end, and in the setting of a video visit, we may have to convert it to a phone-only visit.  In either situation, we cannot ensure that we have a secure connection.  Are you willing to proceed?"  2. Give patient instructions for WebEx download to smartphone as below if video visit  3. Advise patient to be prepared with any vital sign or heart rhythm information, their current medicines, and a piece of paper and pen handy for any instructions they may receive the day of their visit  4. Inform patient they will receive a phone call 15 minutes prior to their appointment time (may be from unknown caller ID) so they should be prepared to answer  5. Confirm that appointment type is correct in Epic appointment notes (video vs telephone)    TELEPHONE CALL NOTE  Melanie Johnson has been deemed a candidate for a follow-up tele-health visit  to limit community exposure during the Covid-19 pandemic. I spoke with the patient via phone to ensure availability of phone/video source, confirm preferred email & phone number, and discuss instructions and expectations.  I reminded Melanie Johnson to be prepared with any vital sign and/or heart rhythm information that could potentially be obtained via home monitoring, at the time of her visit. I reminded Melanie Johnson to expect a phone call at the time of her visit if her visit.  Did the patient verbally acknowledge consent to treatment? YES/verbal  Melanie Platerdrienne C  Johnson 06/08/2018 11:25 AM   DOWNLOADING THE WEBEX SOFTWARE TO SMARTPHONE  - If Apple, go to [employer] Store and type in WebEx in the search bar. Download [employer] Meetings, the blue/green circle. The app is free but as with any other app downloads, their phone may require them to verify saved payment information or Apple password. The patient does NOT have to create an account.  - If Android, ask patient to go to [employer] Play Store and type in WebEx in the search bar. Download [employer] Meetings, the blue/green circle. The app is free but as with any other app downloads, their phone may require them to verify saved payment information or Android password. The patient does NOT have to create an account.   CONSENT FOR TELE-HEALTH VISIT - PLEASE REVIEW  I hereby voluntarily request, consent and authorize [facility] and its employed or contracted physicians, physician assistants, nurse practitioners or other licensed health care professionals (the Practitioner), to provide me with telemedicine health care services (the Services") as deemed necessary by the treating Practitioner. I acknowledge and consent to receive the Services by the Practitioner via telemedicine. I understand that the telemedicine visit will involve communicating with the Practitioner through live audiovisual communication technology and the disclosure of certain medical information by electronic transmission. I acknowledge that I have been given the opportunity to request an in-person assessment or other available alternative prior to the telemedicine visit and am voluntarily participating in the telemedicine visit.  I understand that I have the right to withhold or withdraw my consent to the use of telemedicine in the course of my care at any time, without affecting my right to future care or treatment, and that the Practitioner or I may terminate the telemedicine visit at any time. I understand that I have the right to inspect  all information obtained and/or recorded in the course of the telemedicine visit and may receive copies of available information for a reasonable fee.  I understand that some of the potential risks of receiving the Services via telemedicine include:   Delay or interruption in medical evaluation due to technological equipment failure or disruption;  Information transmitted may not be sufficient (e.g. poor resolution of images) to allow for appropriate medical decision making by the Practitioner; and/or   In rare instances, security protocols could fail, causing a breach of personal health information.  Furthermore, I acknowledge that it is my responsibility to provide information about my medical history, conditions and care that is complete and accurate to the best of my ability. I acknowledge that Practitioner's advice, recommendations, and/or decision may be based on factors not within their control, such as incomplete or inaccurate data provided by me or distortions of diagnostic images or specimens that may result from electronic transmissions. I understand that the practice of medicine is not an exact science and that Practitioner makes no warranties or guarantees regarding treatment outcomes. I acknowledge that I will receive a  copy of this consent concurrently upon execution via email to the email address I last provided but may also request a printed copy by calling the office of [facility].    I understand that my insurance will be billed for this visit.   I have read or had this consent read to me.  I understand the contents of this consent, which adequately explains the benefits and risks of the Services being provided via telemedicine.   I have been provided ample opportunity to ask questions regarding this consent and the Services and have had my questions answered to my satisfaction.  I give my informed consent for the services to be provided through the use of telemedicine in my  medical care  By participating in this telemedicine visit I agree to the above.

## 2018-06-14 ENCOUNTER — Telehealth (INDEPENDENT_AMBULATORY_CARE_PROVIDER_SITE_OTHER): Payer: BLUE CROSS/BLUE SHIELD | Admitting: Cardiology

## 2018-06-14 ENCOUNTER — Encounter: Payer: Self-pay | Admitting: Cardiology

## 2018-06-14 ENCOUNTER — Other Ambulatory Visit: Payer: Self-pay

## 2018-06-14 VITALS — BP 170/94 | HR 73 | Temp 96.8°F | Wt 138.0 lb

## 2018-06-14 DIAGNOSIS — I5042 Chronic combined systolic (congestive) and diastolic (congestive) heart failure: Secondary | ICD-10-CM

## 2018-06-14 DIAGNOSIS — I2510 Atherosclerotic heart disease of native coronary artery without angina pectoris: Secondary | ICD-10-CM | POA: Diagnosis not present

## 2018-06-14 DIAGNOSIS — Z7189 Other specified counseling: Secondary | ICD-10-CM

## 2018-06-14 DIAGNOSIS — E7800 Pure hypercholesterolemia, unspecified: Secondary | ICD-10-CM

## 2018-06-14 DIAGNOSIS — I119 Hypertensive heart disease without heart failure: Secondary | ICD-10-CM

## 2018-06-14 MED ORDER — FUROSEMIDE 20 MG PO TABS: 20.0000 mg | ORAL_TABLET | ORAL | 1 refills | Status: DC

## 2018-06-14 NOTE — Progress Notes (Signed)
Virtual Visit via telephone, unable to establish a video link   This visit type was conducted due to national recommendations for restrictions regarding the COVID-19 Pandemic (e.g. social distancing) in an effort to limit this patient's exposure and mitigate transmission in our community.  Due to her co-morbid illnesses, this patient is at least at moderate risk for complications without adequate follow up.  This format is felt to be most appropriate for this patient at this time.  All issues noted in this document were discussed and addressed.  A limited physical exam was performed with this format.  Please refer to the patient's chart for her consent to telehealth for [facility].   Evaluation Performed:  Follow-up visit  Date:  06/14/2018   ID:  Melanie Johnson, DOB January 15, 1966, MRN [id number]  Patient Location: Home  Provider Location: Home  PCP:  Alinda DeemPenner, Pamela, MD  Cardiologist:  Norman HerrlichBrian Roxsana Riding, MD  Electrophysiologist:  None   Chief Complaint:  CAD  History of Present Illness:    Melanie Johnson is a 53 y.o. female who presents via audio/video conferencing for a telehealth visit today.    Melanie Johnson is a 53 y.o. female with a hx of  anterior STEMI with [facility] at which time she had PCI and stenting of her left anterior descending coronary artery.  Ejection fraction was 35 to 40% with dyskinesia of the mid and apical anterior septal anterior and apical myocardium.  Repeat echocardiogram performed 01/28/2018 with concerns about ventricular aneurysm thrombus shows an ejection fraction of 55 to 60% with hypokinesia of the mid and apical anteroseptal and apical anterior myocardium there is also focal apical akinesia and no thrombus. She was last seen 03/11/18.  The patient does not have symptoms concerning for COVID-19 infection (fever, chills, cough, or new shortness of breath).   Unfortunately she is out of work and feels under intense stress.  Fortunately  she has had no chest pain edema shortness of breath palpitation or syncope but she no longer weighs daily does not check her home blood pressure and is not restricting dietary sodium.  BP is elevated today and during the visit she rechecked at 168/94.  We discussed options of adding additional antihypertensive she would prefer to restrict dietary sodium monitor blood pressure daily for 2 weeks and remains greater than 140 to call my office and we will uptitrate her Entresto and/or add hydralazine.  She is compliant with her dual antiplatelet therapy I will see back in the office in 3 months and at that time we will recheck lab work including a potassium renal function liver and lipids.  She is not having difficulty affording medications  Past Medical History:  Diagnosis Date   Hypertension    Thyroid disease    Past Surgical History:  Procedure Laterality Date   CORONARY/GRAFT ACUTE MI REVASCULARIZATION N/A 01/10/2018   Procedure: Coronary/Graft Acute MI Revascularization;  Surgeon: Tonny Bollmanooper, Michael, MD;  [facility];  Service: Cardiovascular;  Laterality: N/A;   LEFT HEART CATH AND CORONARY ANGIOGRAPHY N/A 01/10/2018   Procedure: LEFT HEART CATH AND CORONARY ANGIOGRAPHY;  Surgeon: Tonny Bollmanooper, Michael, MD;  [facility];  Service: Cardiovascular;  Laterality: N/A;     Current Meds  Medication Sig   aspirin EC 81 MG tablet Take 81 mg by mouth daily.   atorvastatin (LIPITOR) 80 MG tablet Take 1 tablet (80 mg total) by mouth daily.   carvedilol (COREG) 12.5 MG tablet Take 1 tablet (12.5  mg total) by mouth 2 (two) times daily with a meal.   furosemide (LASIX) 20 MG tablet Take 1 tablet (20 mg total) by mouth once a week.   levothyroxine (SYNTHROID, LEVOTHROID) 75 MCG tablet Take 75 mcg by mouth daily before breakfast.   nitroGLYCERIN (NITROSTAT) 0.4 MG SL tablet Place 1 tablet (0.4 mg total) under the tongue every 5 (five) minutes x 3 doses as needed for chest  pain.   omeprazole (PRILOSEC) 20 MG capsule Take 1 capsule (20 mg total) by mouth daily.   sacubitril-valsartan (ENTRESTO) 49-51 MG Take 1 tablet by mouth 2 (two) times daily.   ticagrelor (BRILINTA) 90 MG TABS tablet Take 1 tablet (90 mg total) by mouth 2 (two) times daily.     Allergies:   Penicillins   Social History   Tobacco Use   Smoking status: Former Smoker    Packs/day: 2.00    Years: 25.00    Pack years: 50.00    Types: Cigarettes    Last attempt to quit: 01/11/2018    Years since quitting: 0.4   Smokeless tobacco: Never Used  Substance Use Topics   Alcohol use: No    Frequency: Never   Drug use: No     Family Hx: The patient's family history includes CAD in her father; Heart attack in her father; Heart disease in her father.  ROS:   Please see the history of present illness.     All other systems reviewed and are negative.   Prior CV studies:   The following studies were reviewed today:    Labs/Other Tests and Data Reviewed:    EKG:  No ECG reviewed.  Recent Labs: 01/11/2018: B Natriuretic Peptide 74.6; Hemoglobin 14.6; Hemoglobin 14.7; Magnesium 1.9; Platelets 225; Platelets 209; TSH 3.607 03/11/2018: ALT 21; BUN 8; Creatinine, Ser 0.59; NT-Pro BNP 508; Potassium 3.8; Sodium 139   Recent Lipid Panel Lab Results  Component Value Date/Time   CHOL 122 03/11/2018 10:01 AM   TRIG 96 03/11/2018 10:01 AM   HDL 48 03/11/2018 10:01 AM   CHOLHDL 2.5 03/11/2018 10:01 AM   CHOLHDL 4.6 01/11/2018 03:42 AM   LDLCALC 55 03/11/2018 10:01 AM    Wt Readings from Last 3 Encounters:  06/14/18 138 lb (62.6 kg)  03/11/18 127 lb 4 oz (57.7 kg)  01/19/18 125 lb (56.7 kg)     Objective:    Vital Signs:  BP (!) 170/94 (BP Location: Right Arm, Patient Position: Sitting, Cuff Size: Normal) Comment: patient states she has not taken her medication today   Pulse 73    Temp (!) 96.8 F (36 C) (Oral)    Wt 138 lb (62.6 kg)    BMI 28.11 kg/m     female in no  acute distress.  She was alert oriented thought process and cognition were normal   ASSESSMENT & PLAN:    1. CAD, stable continue dual antiplatelet therapy and reassess in the office in 3 months.  New [place] Heart Association class I having no angina on current medical treatment including beta-blocker 2. Heart failure stable asymptomatic New [place] Heart Association class I I asked her to rigorously sodium restrict continue minimum dose of diuretic and guideline directed therapy with carvedilol and Entresto.  If BP remains greater than 140 systolic we will uptitrate Entresto 3. Hypertension, appears to be poorly controlled I think the issue here is dietary sodium and lack of self-management she will restrict her sodium check blood pressure daily 2 weeks and a decision  to be made 4. Hyperlipidemia stable continue her current high intensity statin last lipid profile was ideal we will plan to recheck in office follow-up 3 months  COVID-19 Education: The signs and symptoms of COVID-19 were discussed with the patient and how to seek care for testing (follow up with PCP or arrange E-visit).  The importance of social distancing was discussed today.  Time:   Today, I have spent 23 minutes with the patient with telehealth technology discussing the above problems.     Medication Adjustments/Labs and Tests Ordered: Current medicines are reviewed at length with the patient today.  Concerns regarding medicines are outlined above.   Tests Ordered: No orders of the defined types were placed in this encounter.   Medication Changes: No orders of the defined types were placed in this encounter.   Disposition:  Follow up in 3 month(s)  Signed, Norman Herrlich, MD  06/14/2018 11:01 AM    [facility]

## 2018-06-14 NOTE — Patient Instructions (Addendum)
RN called patient to go over AVS information on 06/14/18 @ 1145. Scheduled for f/u televisit, no further questions at this moment.    Medication Instructions:  Your physician recommends that you continue on your current medications as directed. Please refer to the Current Medication list given to you today.  If you need a refill on your cardiac medications before your next appointment, please call your pharmacy.   Lab work: NONE If you have labs (blood work) drawn today and your tests are completely normal, you will receive your results only by: Marland Kitchen MyChart Message (if you have MyChart) OR . A paper copy in the mail If you have any lab test that is abnormal or we need to change your treatment, we will call you to review the results.  Testing/Procedures: NONE Follow-Up: At [facility], you and your health needs are our priority.  As part of our continuing mission to provide you with exceptional heart care, we have created designated Provider Care Teams.  These Care Teams include your primary Cardiologist (physician) and Advanced Practice Providers (APPs -  Physician Assistants and Nurse Practitioners) who all work together to provide you with the care you need, when you need it. You will need a follow up appointment in 3 months.    Any Other Special Instructions Will Be Listed Below    Cooking With Less Salt Cooking with less salt is one way to reduce the amount of sodium you get from food. Depending on your condition and overall health, your health care provider or diet and nutrition specialist (dietitian) may recommend that you reduce your sodium intake. Most people should have less than 2,300 milligrams (mg) of sodium each day. If you have high blood pressure (hypertension), you may need to limit your sodium to 1,500 mg each day. Follow the tips below to help reduce your sodium intake. What do I need to know about cooking with less salt? Shopping  Buy sodium-free or low-sodium  products. Look for the following words on food labels: ? Low-sodium. ? Sodium-free. ? Reduced-sodium. ? No salt added. ? Unsalted.  Buy fresh or frozen vegetables. Avoid canned vegetables.  Avoid buying meats or protein foods that have been injected with broth or saline solution.  Avoid cured or smoked meats, such as hot dogs, bacon, salami, ham, and bologna. Reading food labels   Check the food label before buying or using packaged ingredients.  Look for products with no more than 140 mg of sodium in one serving.  Do not choose foods with salt as one of the first three ingredients on the ingredients list. If salt is one of the first three ingredients, it usually means the item is high in sodium, because ingredients are listed in order of amount in the food item. Cooking  Use herbs, seasonings without salt, and spices as substitutes for salt in foods.  Use sodium-free baking soda when baking.  Grill, braise, or roast foods to add flavor with less salt.  Avoid adding salt to pasta, rice, or hot cereals while cooking.  Drain and rinse canned vegetables before use.  Avoid adding salt when cooking sweets and desserts.  Cook with low-sodium ingredients. What are some salt alternatives? The following are herbs, seasonings, and spices that can be used instead of salt to give taste to your food. Herbs should be fresh or dried. Do not choose packaged mixes. Next to the name of the herb, spice, or seasoning are some examples of foods you can pair it with.  Herbs  Bay leaves - Soups, meat and vegetable dishes, and spaghetti sauce.  Basil - NVR Inctalian dishes, soups, pasta, and fish dishes.  Cilantro - Meat, poultry, and vegetable dishes.  Chili powder - Marinades and Mexican dishes.  Chives - Salad dressings and potato dishes.  Cumin - Mexican dishes, couscous, and meat dishes.  Dill - Fish dishes, sauces, and salads.  Fennel - Meat and vegetable dishes, breads, and cookies.   Garlic (do not use garlic salt) - [place] dishes, meat dishes, salad dressings, and sauces.  Marjoram - Soups, potato dishes, and meat dishes.  Oregano - Pizza and spaghetti sauce.  Parsley - Salads, soups, pasta, and meat dishes.  Rosemary - [place] dishes, salad dressings, soups, and red meats.  Saffron - Fish dishes, pasta, and some poultry dishes.  Sage - Stuffings and sauces.  Tarragon - Fish and Whole Foodspoultry dishes.  Thyme - Stuffing, meat, and fish dishes. Seasonings  Lemon juice - Fish dishes, poultry dishes, vegetables, and salads.  Vinegar - Salad dressings, vegetables, and fish dishes. Spices  Cinnamon - Sweet dishes, such as cakes, cookies, and puddings.  Cloves - Gingerbread, puddings, and marinades for meats.  Curry - Vegetable dishes, fish and poultry dishes, and stir-fry dishes.  Ginger - Vegetables dishes, fish dishes, and stir-fry dishes.  Nutmeg - Pasta, vegetables, poultry, fish dishes, and custard. What are some low-sodium ingredients and foods?  Fresh or frozen fruits and vegetables with no sauce added.  Fresh or frozen whole meats, poultry, and fish with no sauce added.  Eggs.  Noodles, pasta, quinoa, rice.  Shredded or puffed wheat or puffed rice.  Regular or quick oats.  Milk, yogurt, hard cheeses, and low-sodium cheeses. Good cheese choices include Swiss, [employer], and [address]zz[address]lla. Always check the label for the serving size and sodium content.  Unsalted butter or margarine.  Unsalted nuts.  Sherbet or ice cream (keep to  cup per serving).  Homemade pudding.  Sodium-free baking soda and baking powder. This is not a complete list of low-sodium ingredients and foods. Contact your dietitian for more options. Summary  Cooking with less salt is one way to reduce the amount of sodium that you get from food.  Buy sodium-free or low-sodium products.  Check the food label before using or buying packaged ingredients.  Use herbs,  seasonings without salt, and spices as substitutes for salt in foods. This information is not intended to replace advice given to you by your health care provider. Make sure you discuss any questions you have with your health care provider. Document Released: 02/16/2005 Document Revised: 02/25/2016 Document Reviewed: 02/25/2016 Elsevier Interactive Patient Education  2019 [employer]

## 2018-06-14 NOTE — Addendum Note (Signed)
Addended by: Pamala Hurry on: 06/14/2018 11:47 AM   Modules accepted: Orders

## 2018-06-21 ENCOUNTER — Telehealth: Payer: Self-pay | Admitting: Cardiology

## 2018-06-21 NOTE — Telephone Encounter (Signed)
Patient calling to report elevated BP's since last office visit. States Dr. Dulce Sellar asked her to check, record BP's for 1 week and call us with results to determine if BP meds should be adjusted. Reports 180/100 reading 3-4 days ago. She began daily walking 2 days ago and her BP today was 153/93. Encouraged patient to continue walking and will forward BP's to Dr. Dulce Sellar

## 2018-06-21 NOTE — Telephone Encounter (Signed)
Please advise. Thanks.  

## 2018-06-21 NOTE — Telephone Encounter (Signed)
Patient called to let Dr. Ashley Mariner her BP is still elevated, 158/93 but was 180/100. Please advise.

## 2018-06-22 ENCOUNTER — Other Ambulatory Visit: Payer: Self-pay | Admitting: Cardiology

## 2018-06-22 MED ORDER — HYDRALAZINE HCL 25 MG PO TABS: 12.5000 mg | ORAL_TABLET | Freq: Two times a day (BID) | ORAL | 1 refills | Status: DC | PRN

## 2018-06-22 NOTE — Telephone Encounter (Signed)
Carvedilol sent to [facility]

## 2018-06-22 NOTE — Addendum Note (Signed)
Addended by: Jacques Earthly A on: 06/22/2018 12:14 PM   Modules accepted: Orders

## 2018-06-22 NOTE — Telephone Encounter (Signed)
Phoned patient, informed that Dr. Dulce Sellar starting her on a new medication Hydralazine twice a day only as needed for BP greater than 150/90. Hydralazine prescription sent to [facility] per pt request.

## 2018-06-22 NOTE — Telephone Encounter (Signed)
New hydralazine 12.5 mg BID call if remains > 150/90

## 2018-09-06 ENCOUNTER — Telehealth: Payer: Self-pay | Admitting: Cardiology

## 2018-09-06 NOTE — Telephone Encounter (Signed)
Virtual Visit Pre-Appointment Phone Call  "(Name), I am calling you today to discuss your upcoming appointment. We are currently trying to limit exposure to the virus that causes COVID-19 by seeing patients at home rather than in the office."  1. "What is the BEST phone number to call the day of the visit?" - include this in appointment notes  2. Do you have or have access to (through a family member/friend) a smartphone with video capability that we can use for your visit?" a. If yes - list this number in appt notes as cell (if different from BEST phone #) and list the appointment type as a VIDEO visit in appointment notes b. If no - list the appointment type as a PHONE visit in appointment notes  3. Confirm consent - "In the setting of the current Covid19 crisis, you are scheduled for a (phone or video) visit with your provider on (date) at (time).  Just as we do with many in-office visits, in order for you to participate in this visit, we must obtain consent.  If you'd like, I can send this to your mychart (if signed up) or email for you to review.  Otherwise, I can obtain your verbal consent now.  All virtual visits are billed to your insurance company just like a normal visit would be.  By agreeing to a virtual visit, we'd like you to understand that the technology does not allow for your provider to perform an examination, and thus may limit your provider's ability to fully assess your condition. If your provider identifies any concerns that need to be evaluated in person, we will make arrangements to do so.  Finally, though the technology is pretty good, we cannot assure that it will always work on either your or our end, and in the setting of a video visit, we may have to convert it to a phone-only visit.  In either situation, we cannot ensure that we have a secure connection.  Are you willing to proceed?" STAFF: Did the patient verbally acknowledge consent to telehealth visit? Document  YES/NO here: yes per pt  4. Advise patient to be prepared - "Two hours prior to your appointment, go ahead and check your blood pressure, pulse, oxygen saturation, and your weight (if you have the equipment to check those) and write them all down. When your visit starts, your provider will ask you for this information. If you have an Apple Watch or Kardia device, please plan to have heart rate information ready on the day of your appointment. Please have a pen and paper handy nearby the day of the visit as well."  5. Give patient instructions for MyChart download to smartphone OR Doximity/Doxy.me as below if video visit (depending on what platform provider is using)  6. Inform patient they will receive a phone call 15 minutes prior to their appointment time (may be from unknown caller ID) so they should be prepared to answer    TELEPHONE CALL NOTE  Melanie NeighborsSherry Teaster has been deemed a candidate for a follow-up tele-health visit to limit community exposure during the Covid-19 pandemic. I spoke with the patient via phone to ensure availability of phone/video source, confirm preferred email & phone number, and discuss instructions and expectations.  I reminded Melanie NeighborsSherry Stalder to be prepared with any vital sign and/or heart rhythm information that could potentially be obtained via home monitoring, at the time of her visit. I reminded Melanie NeighborsSherry Delima to expect a phone call prior to her  visit.  Calla Kicks 09/06/2018 1:11 PM   INSTRUCTIONS FOR DOWNLOADING THE MYCHART APP TO SMARTPHONE  - The patient must first make sure to have activated MyChart and know their login information - If Apple, go to [employer] and type in MyChart in the search bar and download the app. If Android, ask patient to go to [employer] and type in [place] in the search bar and download the app. The app is free but as with any other app downloads, their phone may require them to verify saved payment information or Apple/Android  password.  - The patient will need to then log into the app with their MyChart username and password, and select [facility] as their healthcare provider to link the account. When it is time for your visit, go to the MyChart app, find appointments, and click Begin Video Visit. Be sure to Select Allow for your device to access the Microphone and Camera for your visit. You will then be connected, and your provider will be with you shortly.  **If they have any issues connecting, or need assistance please contact MyChart service desk [phone])**  **If using a computer, in order to ensure the best quality for their visit they will need to use either of the following Internet Browsers: [employer], or Google Chrome**  IF USING DOXIMITY or DOXY.ME - The patient will receive a link just prior to their visit by text.     FULL LENGTH CONSENT FOR TELE-HEALTH VISIT   I hereby voluntarily request, consent and authorize [facility] and its employed or contracted physicians, physician assistants, nurse practitioners or other licensed health care professionals (the Practitioner), to provide me with telemedicine health care services (the Services") as deemed necessary by the treating Practitioner. I acknowledge and consent to receive the Services by the Practitioner via telemedicine. I understand that the telemedicine visit will involve communicating with the Practitioner through live audiovisual communication technology and the disclosure of certain medical information by electronic transmission. I acknowledge that I have been given the opportunity to request an in-person assessment or other available alternative prior to the telemedicine visit and am voluntarily participating in the telemedicine visit.  I understand that I have the right to withhold or withdraw my consent to the use of telemedicine in the course of my care at any time, without affecting my right to future care or treatment,  and that the Practitioner or I may terminate the telemedicine visit at any time. I understand that I have the right to inspect all information obtained and/or recorded in the course of the telemedicine visit and may receive copies of available information for a reasonable fee.  I understand that some of the potential risks of receiving the Services via telemedicine include:   Delay or interruption in medical evaluation due to technological equipment failure or disruption;  Information transmitted may not be sufficient (e.g. poor resolution of images) to allow for appropriate medical decision making by the Practitioner; and/or   In rare instances, security protocols could fail, causing a breach of personal health information.  Furthermore, I acknowledge that it is my responsibility to provide information about my medical history, conditions and care that is complete and accurate to the best of my ability. I acknowledge that Practitioner's advice, recommendations, and/or decision may be based on factors not within their control, such as incomplete or inaccurate data provided by me or distortions of diagnostic images or specimens that may result from electronic transmissions. I understand  that the practice of medicine is not an exact science and that Practitioner makes no warranties or guarantees regarding treatment outcomes. I acknowledge that I will receive a copy of this consent concurrently upon execution via email to the email address I last provided but may also request a printed copy by calling the office of [facility].    I understand that my insurance will be billed for this visit.   I have read or had this consent read to me.  I understand the contents of this consent, which adequately explains the benefits and risks of the Services being provided via telemedicine.   I have been provided ample opportunity to ask questions regarding this consent and the Services and have had my questions  answered to my satisfaction.  I give my informed consent for the services to be provided through the use of telemedicine in my medical care  By participating in this telemedicine visit I agree to the above.

## 2018-09-06 NOTE — Progress Notes (Signed)
Virtual Visit via Video Note   This visit type was conducted due to national recommendations for restrictions regarding the COVID-19 Pandemic (e.g. social distancing) in an effort to limit this patient's exposure and mitigate transmission in our community.  Due to her co-morbid illnesses, this patient is at least at moderate risk for complications without adequate follow up.  This format is felt to be most appropriate for this patient at this time.  All issues noted in this document were discussed and addressed.  A limited physical exam was performed with this format.  Please refer to the patient's chart for her consent to telehealth [facility]. Date:  09/07/2018   ID:  Melanie NeighborsSherry Johnson, DOB December 17, 1965, MRN [id number]  PCP: Dr Karin Lieuobins  Cardiologist:  Norman HerrlichBrian Munley, MD    Referring MD: Alinda DeemPenner, Pamela, MD    ASSESSMENT:    1. Hypertensive heart disease, unspecified whether heart failure present   2. CAD in native artery   3. Pure hypercholesterolemia    PLAN:    In order of problems listed above:  1. Blood pressure poorly controlled we will increase the dose of hydralazine*sodium restrict continue her other antihypertensives including carvedilol furosemide and Entresto.  I told her all honesty and that she is sodium restriction not sure we could control her hypertension. 2. Stable CAD continue current treatment including dual antiplatelet see her in 6 months.  I do not think she requires a repeat ischemia evaluation at this time 3. Continue high intensity statin labs tomorrow for echo efficacy goal LDL less than 70 ideally less than 55 and to exclude liver toxicity   Next appointment: 6 months   Medication Adjustments/Labs and Tests Ordered: Current medicines are reviewed at length with the patient today.  Concerns regarding medicines are outlined above.  No orders of the defined types were placed in this encounter.  No orders of the defined types were placed in this  encounter.   No chief complaint on file.   History of Present Illness:     Melanie Johnson is a 53 y.o. female with a hx of  anterior STEMI [facility] at which time she had PCI and stenting of her left anterior descending coronary artery.  Ejection fraction was 35 to 40% with dyskinesia of the mid and apical anterior septal anterior and apical myocardium.  Repeat echocardiogram performed 01/28/2018 with concerns about ventricular aneurysm thrombus shows an ejection fraction of 55 to 60% with hypokinesia of the mid and apical anteroseptal and apical anterior myocardium there is also focal apical akinesia and no thrombus. She was last seen 94/14/2020 with decompensated heart failure stable COPD and elevated blood pressure.  last seen 06/14/2018.  She is having a new primary care physician Dr. [name] office [facility] will have lab work done including liver function lipid profile renal function potassium.  Unfortunately blood pressure is elevated is adding salt to her diet I asked her to stop she has had some shoulder discomfort but is having no angina dyspnea edema palpitation or syncope.. Compliance with diet, lifestyle and medications: Yes Past Medical History:  Diagnosis Date  . Hypertension   . Thyroid disease     Past Surgical History:  Procedure Laterality Date  . CORONARY/GRAFT ACUTE MI REVASCULARIZATION N/A 01/10/2018   Procedure: Coronary/Graft Acute MI Revascularization;  Surgeon: Tonny Bollmanooper, Michael, MD;  [facility];  Service: Cardiovascular;  Laterality: N/A;  . LEFT HEART CATH AND CORONARY ANGIOGRAPHY  N/A 01/10/2018   Procedure: LEFT HEART CATH AND CORONARY ANGIOGRAPHY;  Surgeon: Tonny Bollmanooper, Michael, MD;  [facility];  Service: Cardiovascular;  Laterality: N/A;    Current Medications: Current Meds  Medication Sig  . aspirin EC 81 MG tablet Take 81 mg by mouth daily.  Marland Kitchen. atorvastatin (LIPITOR) 80 MG tablet  Take 1 tablet (80 mg total) by mouth daily.  . carvedilol (COREG) 12.5 MG tablet TAKE 1 TABLET BY MOUTH TWICE (2) DAILY WITH a meal  . furosemide (LASIX) 20 MG tablet Take 1 tablet (20 mg total) by mouth once a week.  . hydrALAZINE (APRESOLINE) 25 MG tablet Take 0.5 tablets (12.5 mg total) by mouth 2 (two) times daily as needed (for BP greater than 150/90).  Marland Kitchen. levothyroxine (SYNTHROID) 150 MCG tablet Take 150 mcg by mouth daily before breakfast.   . nitroGLYCERIN (NITROSTAT) 0.4 MG SL tablet Place 1 tablet (0.4 mg total) under the tongue every 5 (five) minutes x 3 doses as needed for chest pain.  Marland Kitchen. omeprazole (PRILOSEC) 20 MG capsule Take 1 capsule (20 mg total) by mouth daily.  . sacubitril-valsartan (ENTRESTO) 49-51 MG Take 1 tablet by mouth 2 (two) times daily.  . ticagrelor (BRILINTA) 90 MG TABS tablet Take 1 tablet (90 mg total) by mouth 2 (two) times daily.     Allergies:   Penicillins   Social History   Socioeconomic History  . Marital status: Married    Spouse name: Not on file  . Number of children: Not on file  . Years of education: Not on file  . Highest education level: Not on file  Occupational History  . Not on file  Social Needs  . Financial resource strain: Not hard at all  . Food insecurity    Worry: Never true    Inability: Never true  . Transportation needs    Medical: No    Non-medical: No  Tobacco Use  . Smoking status: Former Smoker    Packs/day: 2.00    Years: 25.00    Pack years: 50.00    Types: Cigarettes    Quit date: 01/11/2018    Years since quitting: 0.6  . Smokeless tobacco: Never Used  Substance and Sexual Activity  . Alcohol use: No    Frequency: Never  . Drug use: No  . Sexual activity: Not on file  Lifestyle  . Physical activity    Days per week: 2 days    Minutes per session: 20 min  . Stress: Not at all  Relationships  . Social connections    Talks on phone: More than three times a week    Gets together: More than three times a  week    Attends religious service: 1 to 4 times per year    Active member of club or organization: Yes    Attends meetings of clubs or organizations: 1 to 4 times per year    Relationship status: Married  Other Topics Concern  . Not on file  Social History Narrative  . Not on file     Family History: The patient's family history includes CAD in her father; Heart attack in her father; Heart disease in her father. ROS:   Please see the history of present illness.    All other systems reviewed and are negative.  EKGs/Labs/Other Studies Reviewed:    The following studies were reviewed today:   Recent Labs: 01/11/2018: B Natriuretic Peptide 74.6; Hemoglobin 14.6; Hemoglobin 14.7; Magnesium 1.9; Platelets 225; Platelets  209; TSH 3.607 03/11/2018: ALT 21; BUN 8; Creatinine, Ser 0.59; NT-Pro BNP 508; Potassium 3.8; Sodium 139  Recent Lipid Panel    Component Value Date/Time   CHOL 122 03/11/2018 1001   TRIG 96 03/11/2018 1001   HDL 48 03/11/2018 1001   CHOLHDL 2.5 03/11/2018 1001   CHOLHDL 4.6 01/11/2018 0342   VLDL 19 01/11/2018 0342   LDLCALC 55 03/11/2018 1001    Physical Exam:    VS:  BP (!) 149/101 (BP Location: Right Arm)   Pulse 71   Wt 142 lb (64.4 kg)   SpO2 99%   BMI 28.93 kg/m     Wt Readings from Last 3 Encounters:  09/07/18 142 lb (64.4 kg)  06/14/18 138 lb (62.6 kg)  03/11/18 127 lb 4 oz (57.7 kg)    Constitutional, well-nourished well-developed in no acute distress Vital signs reviewed Eyes, conjunctiva and sclera are normal without pallor or icterus extraocular motions intact and normal there is no lid lag Respiratory, normal effort and excursion no audible wheezing without a stethoscope Cardiovascular, no neck vein distention or peripheral edema Skin, no rash skin lesion or ulceration of the extremities Neurologic, cranial nerves II to XII are grossly intact and the patient moves all 4 extremities Neuro/Psychiatric, judgment and thought processes are  intact and coherent, alert and oriented x3, mood and affect appear normal.    Signed, Shirlee More, MD  09/07/2018 10:49 AM    [place]

## 2018-09-07 ENCOUNTER — Telehealth (INDEPENDENT_AMBULATORY_CARE_PROVIDER_SITE_OTHER): Payer: BLUE CROSS/BLUE SHIELD | Admitting: Cardiology

## 2018-09-07 ENCOUNTER — Other Ambulatory Visit: Payer: Self-pay | Admitting: Cardiology

## 2018-09-07 ENCOUNTER — Encounter: Payer: Self-pay | Admitting: Cardiology

## 2018-09-07 ENCOUNTER — Other Ambulatory Visit: Payer: Self-pay

## 2018-09-07 VITALS — BP 149/101 | HR 71 | Wt 142.0 lb

## 2018-09-07 DIAGNOSIS — I2510 Atherosclerotic heart disease of native coronary artery without angina pectoris: Secondary | ICD-10-CM | POA: Diagnosis not present

## 2018-09-07 DIAGNOSIS — E7800 Pure hypercholesterolemia, unspecified: Secondary | ICD-10-CM

## 2018-09-07 DIAGNOSIS — I119 Hypertensive heart disease without heart failure: Secondary | ICD-10-CM

## 2018-09-07 MED ORDER — HYDRALAZINE HCL 25 MG PO TABS: 25.0000 mg | ORAL_TABLET | Freq: Three times a day (TID) | ORAL | 1 refills | Status: DC

## 2018-09-07 MED ORDER — NITROGLYCERIN 0.4 MG SL SUBL: 0.4000 mg | SUBLINGUAL_TABLET | SUBLINGUAL | 3 refills | Status: DC | PRN

## 2018-09-07 NOTE — Patient Instructions (Addendum)
Medication Instructions:  Your physician has recommended you make the following change in your medication:   INCREASE hydralazine (apresoline) 25 mg: Take 1 tablet three times daily   **Check your blood pressure daily at the same time every day for 2 weeks. Keep a log of these readings and call our office to let us know what the readings are in 2 weeks.   If you need a refill on your cardiac medications before your next appointment, please call your pharmacy.   Lab work: None  If you have labs (blood work) drawn today and your tests are completely normal, you will receive your results only by: Marland Kitchen MyChart Message (if you have MyChart) OR . A paper copy in the mail If you have any lab test that is abnormal or we need to change your treatment, we will call you to review the results.  Testing/Procedures: None  Follow-Up: At [facility], you and your health needs are our priority.  As part of our continuing mission to provide you with exceptional heart care, we have created designated Provider Care Teams.  These Care Teams include your primary Cardiologist (physician) and Advanced Practice Providers (APPs -  Physician Assistants and Nurse Practitioners) who all work together to provide you with the care you need, when you need it. You will need a follow up appointment in 4 months: Wednesday, 01/11/2019, at 9:40 am in the [place] office. Please arrive at 9:25 am.

## 2018-09-08 DIAGNOSIS — K219 Gastro-esophageal reflux disease without esophagitis: Secondary | ICD-10-CM | POA: Insufficient documentation

## 2018-09-08 DIAGNOSIS — Z1211 Encounter for screening for malignant neoplasm of colon: Secondary | ICD-10-CM

## 2018-09-08 HISTORY — DX: Encounter for screening for malignant neoplasm of colon: Z12.11

## 2018-09-08 HISTORY — DX: Gastro-esophageal reflux disease without esophagitis: K21.9

## 2018-11-15 ENCOUNTER — Telehealth: Payer: Self-pay | Admitting: Cardiology

## 2018-11-15 NOTE — Telephone Encounter (Signed)
Has questions about hydralazine dosage

## 2018-11-15 NOTE — Telephone Encounter (Signed)
Telephone call to patient. States she just needed to verify her hydralazine dosage which is 25 mg three times daily. Pt verbalized understanding and had no further questions.

## 2018-11-21 ENCOUNTER — Telehealth: Payer: Self-pay | Admitting: Cardiology

## 2018-11-21 MED ORDER — HYDRALAZINE HCL 25 MG PO TABS: 25.0000 mg | ORAL_TABLET | Freq: Three times a day (TID) | ORAL | 0 refills | Status: DC

## 2018-11-21 NOTE — Telephone Encounter (Signed)
Refill for hydralazine sent to [facility] in [place] as requested.

## 2018-11-21 NOTE — Telephone Encounter (Signed)
Call hydrozaline to [place] [facility]

## 2018-11-21 NOTE — Addendum Note (Signed)
Addended by: Austin Miles on: 11/21/2018 12:34 PM   Modules accepted: Orders

## 2018-12-03 DIAGNOSIS — S42301A Unspecified fracture of shaft of humerus, right arm, initial encounter for closed fracture: Secondary | ICD-10-CM | POA: Insufficient documentation

## 2018-12-03 DIAGNOSIS — T1490XA Injury, unspecified, initial encounter: Secondary | ICD-10-CM | POA: Insufficient documentation

## 2018-12-03 HISTORY — DX: Unspecified fracture of shaft of humerus, right arm, initial encounter for closed fracture: S42.301A

## 2018-12-13 DIAGNOSIS — D649 Anemia, unspecified: Secondary | ICD-10-CM | POA: Insufficient documentation

## 2018-12-13 DIAGNOSIS — R7989 Other specified abnormal findings of blood chemistry: Secondary | ICD-10-CM | POA: Insufficient documentation

## 2018-12-13 DIAGNOSIS — Z09 Encounter for follow-up examination after completed treatment for conditions other than malignant neoplasm: Secondary | ICD-10-CM | POA: Insufficient documentation

## 2018-12-13 HISTORY — DX: Encounter for follow-up examination after completed treatment for conditions other than malignant neoplasm: Z09

## 2018-12-13 HISTORY — DX: Anemia, unspecified: D64.9

## 2018-12-13 HISTORY — DX: Other specified abnormal findings of blood chemistry: R79.89

## 2018-12-20 DIAGNOSIS — I252 Old myocardial infarction: Secondary | ICD-10-CM | POA: Insufficient documentation

## 2018-12-20 HISTORY — DX: Old myocardial infarction: I25.2

## 2018-12-29 DIAGNOSIS — S2243XD Multiple fractures of ribs, bilateral, subsequent encounter for fracture with routine healing: Secondary | ICD-10-CM | POA: Insufficient documentation

## 2018-12-29 HISTORY — DX: Multiple fractures of ribs, bilateral, subsequent encounter for fracture with routine healing: S22.43XD

## 2019-01-11 ENCOUNTER — Other Ambulatory Visit: Payer: Self-pay

## 2019-01-11 ENCOUNTER — Encounter: Payer: Self-pay | Admitting: Cardiology

## 2019-01-11 ENCOUNTER — Ambulatory Visit (INDEPENDENT_AMBULATORY_CARE_PROVIDER_SITE_OTHER): Payer: BLUE CROSS/BLUE SHIELD | Admitting: Cardiology

## 2019-01-11 VITALS — BP 152/102 | HR 88 | Ht 58.5 in | Wt 145.0 lb

## 2019-01-11 DIAGNOSIS — E7800 Pure hypercholesterolemia, unspecified: Secondary | ICD-10-CM

## 2019-01-11 DIAGNOSIS — I119 Hypertensive heart disease without heart failure: Secondary | ICD-10-CM

## 2019-01-11 DIAGNOSIS — I2510 Atherosclerotic heart disease of native coronary artery without angina pectoris: Secondary | ICD-10-CM | POA: Diagnosis not present

## 2019-01-11 DIAGNOSIS — I255 Ischemic cardiomyopathy: Secondary | ICD-10-CM

## 2019-01-11 MED ORDER — FUROSEMIDE 20 MG PO TABS: ORAL_TABLET | ORAL | 5 refills | Status: DC

## 2019-01-11 MED ORDER — CARVEDILOL 12.5 MG PO TABS: ORAL_TABLET | ORAL | 5 refills | Status: DC
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
## 2019-01-11 NOTE — Patient Instructions (Signed)
Medication Instructions:  Your physician has recommended you make the following change in your medication:   RESTART carvedilol (coreg) 12.5 mg: Take 1 tablet twice daily RESTART furosemide (lasix) 20 mg: Take 1 tablet once weekly  *If you need a refill on your cardiac medications before your next appointment, please call your pharmacy*  Lab Work: None  If you have labs (blood work) drawn today and your tests are completely normal, you will receive your results only by: Marland Kitchen MyChart Message (if you have MyChart) OR . A paper copy in the mail If you have any lab test that is abnormal or we need to change your treatment, we will call you to review the results.  Testing/Procedures: You had an EKG today.   Follow-Up: At [facility], you and your health needs are our priority.  As part of our continuing mission to provide you with exceptional heart care, we have created designated Provider Care Teams.  These Care Teams include your primary Cardiologist (physician) and Advanced Practice Providers (APPs -  Physician Assistants and Nurse Practitioners) who all work together to provide you with the care you need, when you need it.  Your next appointment:   6 months  The format for your next appointment:   In Person  Provider:   Shirlee More, MD

## 2019-02-02 ENCOUNTER — Other Ambulatory Visit: Payer: Self-pay | Admitting: *Deleted

## 2019-02-02 MED ORDER — SACUBITRIL-VALSARTAN 49-51 MG PO TABS: 1.0000 | ORAL_TABLET | Freq: Two times a day (BID) | ORAL | 3 refills | Status: DC

## 2019-04-24 ENCOUNTER — Telehealth: Payer: Self-pay | Admitting: Cardiology

## 2019-04-24 NOTE — Telephone Encounter (Signed)
Patient doesn't have insurance anymore, she is wondering if she can have samples of sacubitril-valsartan (ENTRESTO) 49-51 MG, or if she can be put on different medication.

## 2019-04-25 NOTE — Telephone Encounter (Signed)
Spoke with patient. Samples left at front desk. Pt states she DOES have insurance and does not qualify for patient assistance.

## 2019-06-12 ENCOUNTER — Other Ambulatory Visit: Payer: Self-pay | Admitting: Cardiology

## 2019-06-13 DIAGNOSIS — E6609 Other obesity due to excess calories: Secondary | ICD-10-CM | POA: Insufficient documentation

## 2019-06-13 DIAGNOSIS — E559 Vitamin D deficiency, unspecified: Secondary | ICD-10-CM

## 2019-06-13 DIAGNOSIS — E669 Obesity, unspecified: Secondary | ICD-10-CM | POA: Insufficient documentation

## 2019-06-13 DIAGNOSIS — E66811 Obesity, class 1: Secondary | ICD-10-CM | POA: Insufficient documentation

## 2019-06-13 HISTORY — DX: Other obesity due to excess calories: E66.09

## 2019-06-13 HISTORY — DX: Vitamin D deficiency, unspecified: E55.9

## 2019-06-13 HISTORY — DX: Obesity, unspecified: E66.9

## 2019-06-16 ENCOUNTER — Other Ambulatory Visit: Payer: Self-pay | Admitting: Cardiology

## 2019-06-19 ENCOUNTER — Telehealth: Payer: Self-pay | Admitting: Cardiology

## 2019-06-19 MED ORDER — SACUBITRIL-VALSARTAN 49-51 MG PO TABS: 1.0000 | ORAL_TABLET | Freq: Two times a day (BID) | ORAL | 3 refills | Status: DC

## 2019-06-19 NOTE — Telephone Encounter (Signed)
New message   Patient needs a new prescription for sacubitril-valsartan (ENTRESTO) 49-51 MG sent to [facility] - [place], [place] - [id number]

## 2019-06-19 NOTE — Addendum Note (Signed)
Addended by: Delorse Limber I on: 06/19/2019 11:08 AM   Modules accepted: Orders

## 2019-07-18 ENCOUNTER — Ambulatory Visit: Payer: BLUE CROSS/BLUE SHIELD | Admitting: Cardiology

## 2019-07-24 ENCOUNTER — Encounter: Payer: Self-pay | Admitting: Cardiology

## 2019-07-24 ENCOUNTER — Ambulatory Visit (INDEPENDENT_AMBULATORY_CARE_PROVIDER_SITE_OTHER): Payer: BLUE CROSS/BLUE SHIELD | Admitting: Cardiology

## 2019-07-24 ENCOUNTER — Other Ambulatory Visit: Payer: Self-pay

## 2019-07-24 VITALS — BP 130/80 | HR 80 | Ht 58.5 in | Wt 146.6 lb

## 2019-07-24 DIAGNOSIS — E7800 Pure hypercholesterolemia, unspecified: Secondary | ICD-10-CM

## 2019-07-24 DIAGNOSIS — I2510 Atherosclerotic heart disease of native coronary artery without angina pectoris: Secondary | ICD-10-CM | POA: Diagnosis not present

## 2019-07-24 DIAGNOSIS — I119 Hypertensive heart disease without heart failure: Secondary | ICD-10-CM | POA: Diagnosis not present

## 2019-07-24 NOTE — Patient Instructions (Signed)
Medication Instructions:  Your physician recommends that you continue on your current medications as directed. Please refer to the Current Medication list given to you today.  *If you need a refill on your cardiac medications before your next appointment, please call your pharmacy*   Lab Work: None If you have labs (blood work) drawn today and your tests are completely normal, you will receive your results only by: Marland Kitchen MyChart Message (if you have MyChart) OR . A paper copy in the mail If you have any lab test that is abnormal or we need to change your treatment, we will call you to review the results.   Testing/Procedures: None   Follow-Up: At [facility], you and your health needs are our priority.  As part of our continuing mission to provide you with exceptional heart care, we have created designated Provider Care Teams.  These Care Teams include your primary Cardiologist (physician) and Advanced Practice Providers (APPs -  Physician Assistants and Nurse Practitioners) who all work together to provide you with the care you need, when you need it.  We recommend signing up for the patient portal called "MyChart".  Sign up information is provided on this After Visit Summary.  MyChart is used to connect with patients for Virtual Visits (Telemedicine).  Patients are able to view lab/test results, encounter notes, upcoming appointments, etc.  Non-urgent messages can be sent to your provider as well.   To learn more about what you can do with MyChart, go to [email or web address].    Your next appointment:   6 month(s)  The format for your next appointment:   In Person  Provider:   Norman Herrlich, MD   Other Instructions Please continue taking your Aspirin 1-2 days after your surgery.
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
## 2019-09-01 IMAGING — DX DG CHEST 2V
2 series · 2 of 2 positions shown · non-contrast
Comparison: None.

CLINICAL DATA: Short of breath today

EXAM:
CHEST - 2 VIEW

[chest pa]
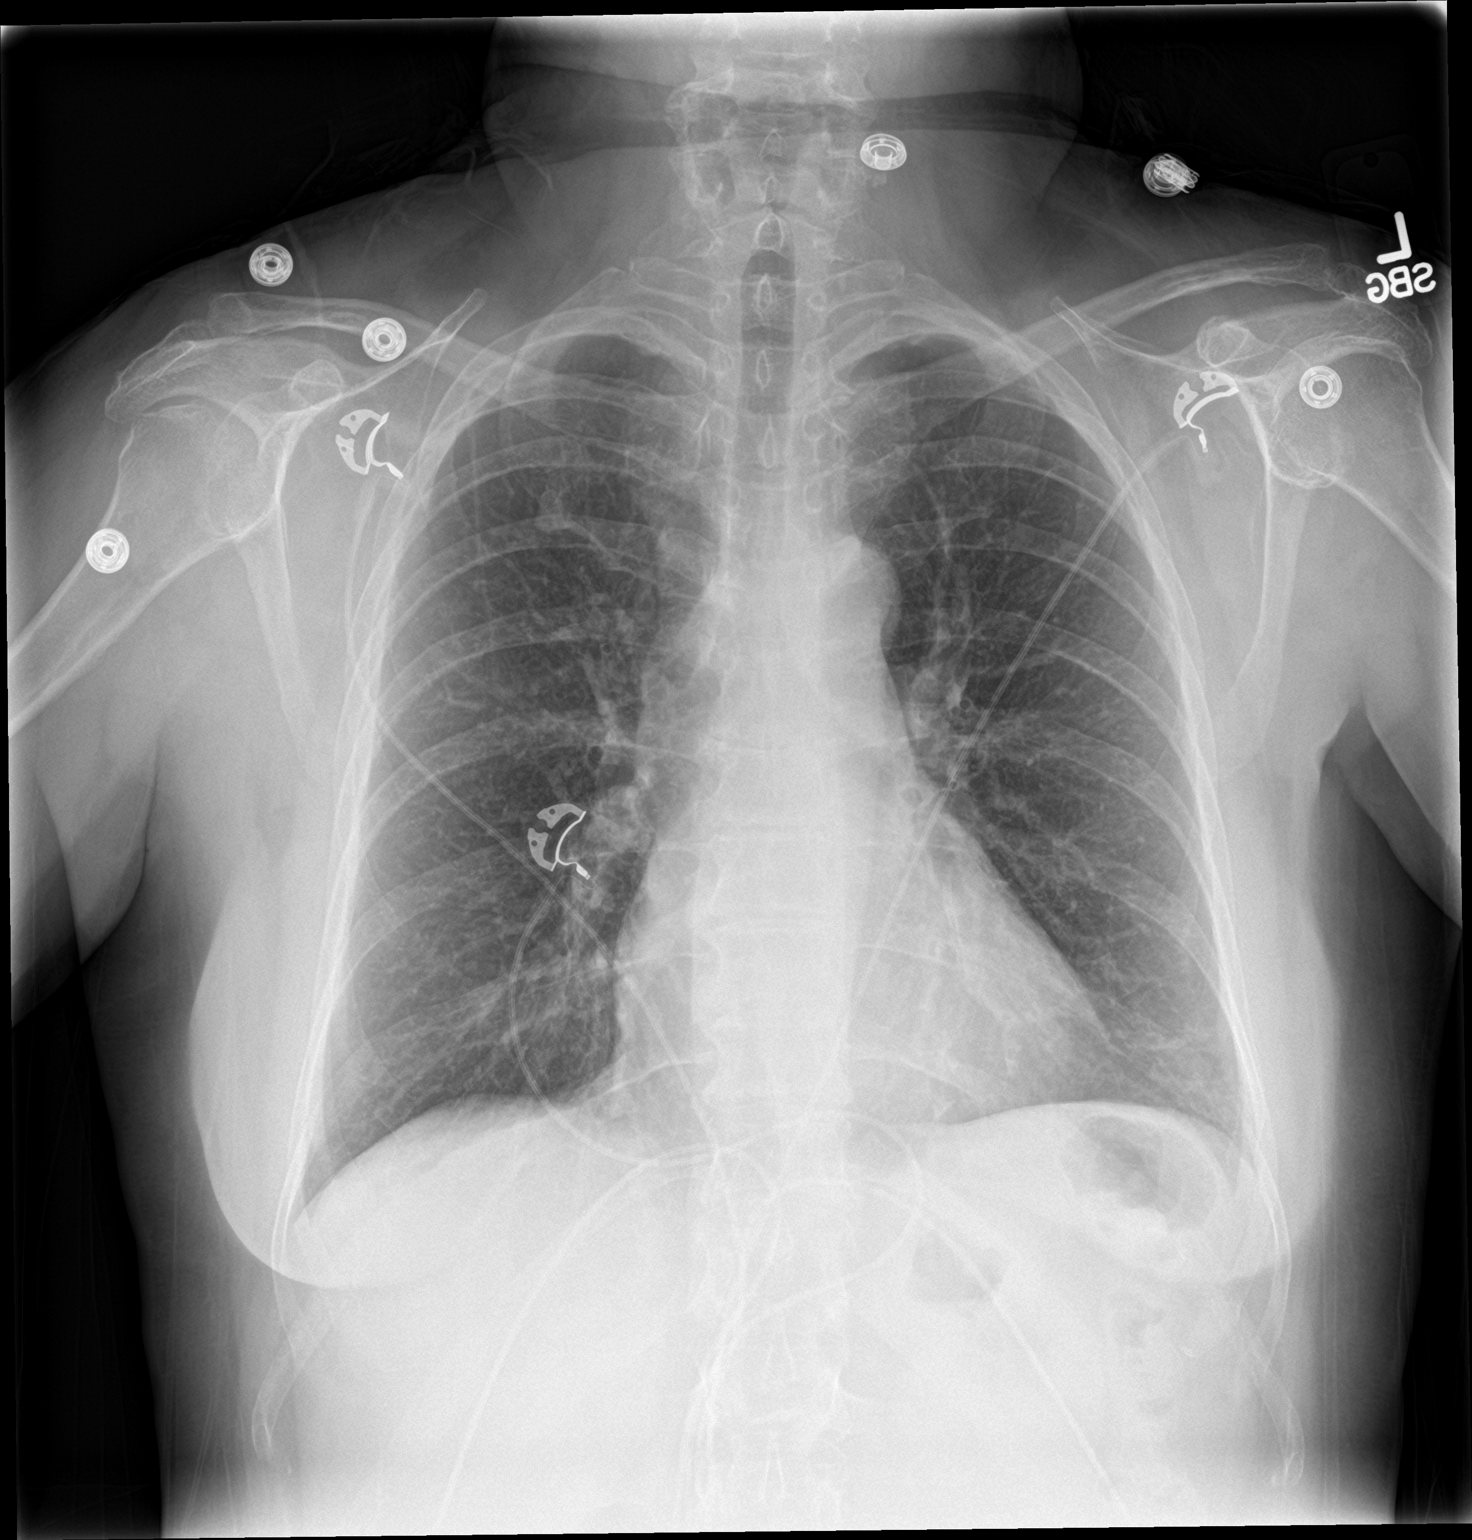

[chest lat]
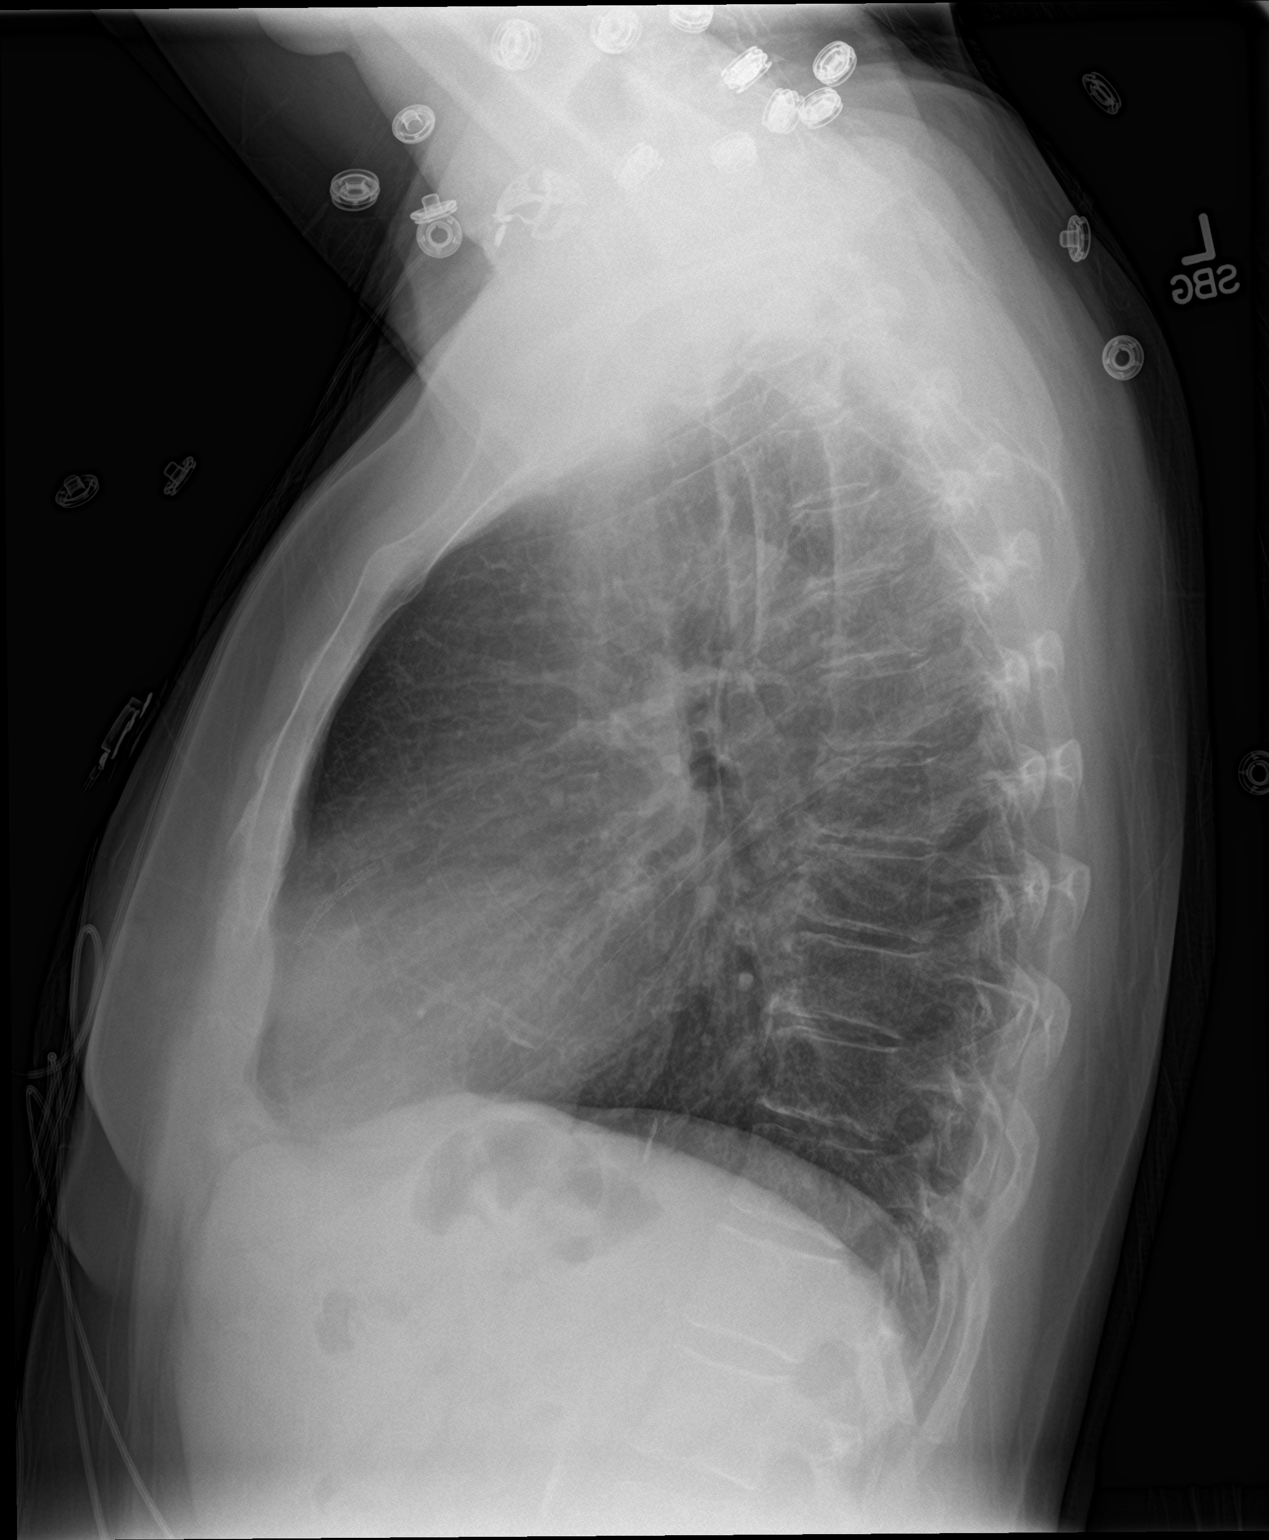

[2 of 2 positions shown; findings below may reference images not displayed]

FINDINGS: Normal heart size. Lungs clear. No pneumothorax. No pleural
effusion.
IMPRESSION: No active cardiopulmonary disease.

## 2019-09-11 ENCOUNTER — Other Ambulatory Visit: Payer: Self-pay | Admitting: Cardiology

## 2019-09-12 DIAGNOSIS — Z9889 Other specified postprocedural states: Secondary | ICD-10-CM | POA: Insufficient documentation

## 2019-12-23 ENCOUNTER — Other Ambulatory Visit: Payer: Self-pay | Admitting: Cardiology

## 2020-01-09 ENCOUNTER — Other Ambulatory Visit: Payer: Self-pay | Admitting: Cardiology

## 2020-01-19 DIAGNOSIS — I10 Essential (primary) hypertension: Secondary | ICD-10-CM | POA: Insufficient documentation

## 2020-01-19 DIAGNOSIS — E079 Disorder of thyroid, unspecified: Secondary | ICD-10-CM | POA: Insufficient documentation
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
## 2020-01-29 ENCOUNTER — Encounter: Payer: Self-pay | Admitting: Cardiology

## 2020-01-29 ENCOUNTER — Ambulatory Visit: Payer: BLUE CROSS/BLUE SHIELD | Admitting: Cardiology

## 2020-01-29 ENCOUNTER — Other Ambulatory Visit: Payer: Self-pay

## 2020-01-29 VITALS — BP 125/79 | HR 63 | Ht 58.5 in | Wt 136.0 lb

## 2020-01-29 DIAGNOSIS — I2510 Atherosclerotic heart disease of native coronary artery without angina pectoris: Secondary | ICD-10-CM | POA: Diagnosis not present

## 2020-01-29 DIAGNOSIS — I119 Hypertensive heart disease without heart failure: Secondary | ICD-10-CM

## 2020-01-29 DIAGNOSIS — E7800 Pure hypercholesterolemia, unspecified: Secondary | ICD-10-CM

## 2020-01-29 LAB — COMPREHENSIVE METABOLIC PANEL
ALT: 25 IU/L (ref 0–32)
AST: 18 IU/L (ref 0–40)
Albumin/Globulin Ratio: 1.8 (ref 1.2–2.2)
Albumin: 4.6 g/dL (ref 3.8–4.9)
Alkaline Phosphatase: 94 IU/L (ref 44–121)
BUN/Creatinine Ratio: 28 — ABNORMAL HIGH (ref 9–23)
BUN: 15 mg/dL (ref 6–24)
Bilirubin Total: 1.3 mg/dL — ABNORMAL HIGH (ref 0.0–1.2)
CO2: 23 mmol/L (ref 20–29)
Calcium: 9.7 mg/dL (ref 8.7–10.2)
Chloride: 102 mmol/L (ref 96–106)
Creatinine, Ser: 0.54 mg/dL — ABNORMAL LOW (ref 0.57–1.00)
GFR calc Af Amer: 125 mL/min/{1.73_m2} (ref >59)
GFR calc non Af Amer: 109 mL/min/{1.73_m2} (ref >59)
Globulin, Total: 2.5 g/dL (ref 1.5–4.5)
Glucose: 107 mg/dL — ABNORMAL HIGH (ref 65–99)
Potassium: 4.1 mmol/L (ref 3.5–5.2)
Sodium: 139 mmol/L (ref 134–144)
Total Protein: 7.1 g/dL (ref 6.0–8.5)

## 2020-01-29 LAB — LIPID PANEL
Chol/HDL Ratio: 2.8 ratio (ref 0.0–4.4)
Cholesterol, Total: 155 mg/dL (ref 100–199)
HDL: 56 mg/dL (ref >39)
LDL Chol Calc (NIH): 77 mg/dL (ref 0–99)
Triglycerides: 123 mg/dL (ref 0–149)
VLDL Cholesterol Cal: 22 mg/dL (ref 5–40)

## 2020-01-29 LAB — PRO B NATRIURETIC PEPTIDE: NT-Pro BNP: 116 pg/mL (ref 0–249)

## 2020-01-29 NOTE — Patient Instructions (Signed)
Medication Instructions:  Your physician recommends that you continue on your current medications as directed. Please refer to the Current Medication list given to you today.  *If you need a refill on your cardiac medications before your next appointment, please call your pharmacy*   Lab Work: Your physician recommends that you return for lab work in: TODAY CMP, Lipids, ProBNP If you have labs (blood work) drawn today and your tests are completely normal, you will receive your results only by: . MyChart Message (if you have MyChart) OR . A paper copy in the mail If you have any lab test that is abnormal or we need to change your treatment, we will call you to review the results.   Testing/Procedures: None   Follow-Up: At [facility], you and your health needs are our priority.  As part of our continuing mission to provide you with exceptional heart care, we have created designated Provider Care Teams.  These Care Teams include your primary Cardiologist (physician) and Advanced Practice Providers (APPs -  Physician Assistants and Nurse Practitioners) who all work together to provide you with the care you need, when you need it.  We recommend signing up for the patient portal called "MyChart".  Sign up information is provided on this After Visit Summary.  MyChart is used to connect with patients for Virtual Visits (Telemedicine).  Patients are able to view lab/test results, encounter notes, upcoming appointments, etc.  Non-urgent messages can be sent to your provider as well.   To learn more about what you can do with MyChart, go to [email or web address].    Your next appointment:   6 month(s)  The format for your next appointment:   In Person  Provider:   Brian Munley, MD   Other Instructions   

## 2020-01-30 ENCOUNTER — Telehealth: Payer: Self-pay

## 2020-01-30 NOTE — Telephone Encounter (Signed)
Patient is returning call to discuss results. 

## 2020-01-30 NOTE — Telephone Encounter (Signed)
Spoke with patient regarding results and recommendation.  Patient verbalizes understanding and is agreeable to plan of care. Advised patient to call back with any issues or concerns.  

## 2020-01-30 NOTE — Telephone Encounter (Signed)
Per Dr. Munley lab results:  Good result no changes in treatment   Left message on patients voicemail to please return our call.    

## 2020-02-14 ENCOUNTER — Telehealth: Payer: Self-pay

## 2020-02-14 NOTE — Telephone Encounter (Signed)
Left VM

## 2020-02-14 NOTE — Telephone Encounter (Signed)
Aspirin OK to hold, can skip AM Entresto day of colonoscopy

## 2020-02-14 NOTE — Telephone Encounter (Addendum)
Dr. Dulce Sellar You saw this patient on 01/29/20. He was doing well s/p PCO in 2019. We have been asked for medical clearance for colonoscopy and to hold ASA. Can you please provide recommendations?  Unclear why we have been asked to hold entresto.

## 2020-02-14 NOTE — Telephone Encounter (Signed)
° °  [facility] Pre-operative Risk Assessment    HEARTCARE STAFF: - Please ensure there is not already an duplicate clearance open for this procedure. - Under Visit Info/Reason for Call, type in Other and utilize the format Clearance MM/DD/YY or Clearance TBD. Do not use dashes or single digits. - If request is for dental extraction, please clarify the # of teeth to be extracted.  Request for surgical clearance:  1. What type of surgery is being performed?  Colonoscopy  2. When is this surgery scheduled?  TBD   3. What type of clearance is required (medical clearance vs. Pharmacy clearance to hold med vs. Both)? Both  4. Are there any medications that need to be held prior to surgery and how long? Aspirin and Entresto. Timeframe unspecified.    5. Practice name and name of physician performing surgery? [facility] [place]   6. What is the office phone number? [phone]   7.   What is the office fax number? [phone]  8.   Anesthesia type (None, local, MAC, general) ? General   Gita Kudo 02/14/2020, 9:57 AM  _________________________________________________________________   (provider comments below)

## 2020-02-18 NOTE — Telephone Encounter (Addendum)
   Primary Cardiologist: Norman Herrlich, MD  Chart reviewed as part of pre-operative protocol coverage. Patient was contacted 02/18/2020 in reference to pre-operative risk assessment for pending surgery as outlined below.  Melanie Johnson was last seen on 01/29/2020 by Dr. Dulce Sellar.  Since that day, Melanie Johnson has done well without chest pain or shortness of breath.  Therefore, based on ACC/AHA guidelines, the patient would be at acceptable risk for the planned procedure without further cardiovascular testing.   She may hold aspirin for 5 days prior to the procedure and hold Entresto in the morning of the procedure.  She should restart both as soon as possible after the procedure at the surgeon's discretion.  The patient was advised that if she develops new symptoms prior to surgery to contact our office to arrange for a follow-up visit, and she verbalized understanding.  I will route this recommendation to the requesting party via Epic fax function and remove from pre-op pool. Please call with questions.  [place], [place] 02/18/2020, 1:17 PM

## 2020-05-01 DIAGNOSIS — J22 Unspecified acute lower respiratory infection: Secondary | ICD-10-CM

## 2020-05-01 HISTORY — DX: Unspecified acute lower respiratory infection: J22

## 2020-05-11 ENCOUNTER — Other Ambulatory Visit: Payer: Self-pay | Admitting: Cardiology

## 2020-05-13 NOTE — Telephone Encounter (Signed)
Lasix approved and sent.  

## 2020-07-03 ENCOUNTER — Other Ambulatory Visit: Payer: Self-pay | Admitting: Cardiology

## 2020-07-04 NOTE — Telephone Encounter (Signed)
Entresto 49-51 mg # 180 tablets x 3 refills sent to pharmacy

## 2020-07-08 ENCOUNTER — Other Ambulatory Visit: Payer: Self-pay | Admitting: Cardiology
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
## 2020-08-29 ENCOUNTER — Other Ambulatory Visit: Payer: Self-pay

## 2020-08-29 ENCOUNTER — Encounter: Payer: Self-pay | Admitting: Cardiology

## 2020-08-29 ENCOUNTER — Ambulatory Visit: Payer: BLUE CROSS/BLUE SHIELD | Admitting: Cardiology

## 2020-08-29 VITALS — BP 134/78 | HR 72 | Ht <= 58 in | Wt 138.4 lb

## 2020-08-29 DIAGNOSIS — I2510 Atherosclerotic heart disease of native coronary artery without angina pectoris: Secondary | ICD-10-CM

## 2020-08-29 DIAGNOSIS — I119 Hypertensive heart disease without heart failure: Secondary | ICD-10-CM | POA: Diagnosis not present

## 2020-08-29 DIAGNOSIS — E7800 Pure hypercholesterolemia, unspecified: Secondary | ICD-10-CM

## 2020-08-29 NOTE — Patient Instructions (Signed)
Medication Instructions:  Your physician recommends that you continue on your current medications as directed. Please refer to the Current Medication list given to you today.  *If you need a refill on your cardiac medications before your next appointment, please call your pharmacy*   Lab Work: Your physician recommends that you return for lab work in: TODAY CMP, Lipids, TSH If you have labs (blood work) drawn today and your tests are completely normal, you will receive your results only by: . MyChart Message (if you have MyChart) OR . A paper copy in the mail If you have any lab test that is abnormal or we need to change your treatment, we will call you to review the results.   Testing/Procedures: None   Follow-Up: At [facility], you and your health needs are our priority.  As part of our continuing mission to provide you with exceptional heart care, we have created designated Provider Care Teams.  These Care Teams include your primary Cardiologist (physician) and Advanced Practice Providers (APPs -  Physician Assistants and Nurse Practitioners) who all work together to provide you with the care you need, when you need it.  We recommend signing up for the patient portal called "MyChart".  Sign up information is provided on this After Visit Summary.  MyChart is used to connect with patients for Virtual Visits (Telemedicine).  Patients are able to view lab/test results, encounter notes, upcoming appointments, etc.  Non-urgent messages can be sent to your provider as well.   To learn more about what you can do with MyChart, go to [email or web address].    Your next appointment:   1 year(s)  The format for your next appointment:   In Person  Provider:   Brian Munley, MD   Other Instructions   

## 2020-08-30 ENCOUNTER — Telehealth: Payer: Self-pay

## 2020-08-30 LAB — COMPREHENSIVE METABOLIC PANEL
ALT: 23 IU/L (ref 0–32)
AST: 19 IU/L (ref 0–40)
Albumin/Globulin Ratio: 1.9 (ref 1.2–2.2)
Albumin: 4.5 g/dL (ref 3.8–4.9)
Alkaline Phosphatase: 78 IU/L (ref 44–121)
BUN/Creatinine Ratio: 25 — ABNORMAL HIGH (ref 9–23)
BUN: 15 mg/dL (ref 6–24)
Bilirubin Total: 0.7 mg/dL (ref 0.0–1.2)
CO2: 23 mmol/L (ref 20–29)
Calcium: 9.3 mg/dL (ref 8.7–10.2)
Chloride: 104 mmol/L (ref 96–106)
Creatinine, Ser: 0.59 mg/dL (ref 0.57–1.00)
Globulin, Total: 2.4 g/dL (ref 1.5–4.5)
Glucose: 96 mg/dL (ref 65–99)
Potassium: 3.9 mmol/L (ref 3.5–5.2)
Sodium: 140 mmol/L (ref 134–144)
Total Protein: 6.9 g/dL (ref 6.0–8.5)
eGFR: 108 mL/min/{1.73_m2} (ref >59)

## 2020-08-30 LAB — LIPID PANEL
Chol/HDL Ratio: 2.3 ratio (ref 0.0–4.4)
Cholesterol, Total: 124 mg/dL (ref 100–199)
HDL: 53 mg/dL (ref >39)
LDL Chol Calc (NIH): 50 mg/dL (ref 0–99)
Triglycerides: 116 mg/dL (ref 0–149)
VLDL Cholesterol Cal: 21 mg/dL (ref 5–40)

## 2020-08-30 LAB — TSH: TSH: 7.41 u[IU]/mL — ABNORMAL HIGH (ref 0.450–4.500)

## 2020-08-30 NOTE — Telephone Encounter (Signed)
Spoke with patient regarding results and recommendation.  Patient verbalizes understanding and is agreeable to plan of care. Advised patient to call back with any issues or concerns.  

## 2020-08-30 NOTE — Telephone Encounter (Signed)
-----   Message from Baldo Daub, MD sent at 08/30/2020  8:19 AM EDT ----- Normal or stable result  No changes her lipids are at target

## 2020-10-11 ENCOUNTER — Other Ambulatory Visit: Payer: Self-pay | Admitting: Cardiology

## 2020-11-14 ENCOUNTER — Other Ambulatory Visit: Payer: Self-pay | Admitting: Cardiology

## 2020-11-17 DIAGNOSIS — L989 Disorder of the skin and subcutaneous tissue, unspecified: Secondary | ICD-10-CM | POA: Insufficient documentation

## 2020-11-17 HISTORY — DX: Disorder of the skin and subcutaneous tissue, unspecified: L98.9

## 2020-11-19 DIAGNOSIS — Z4802 Encounter for removal of sutures: Secondary | ICD-10-CM | POA: Insufficient documentation

## 2020-11-19 HISTORY — DX: Encounter for removal of sutures: Z48.02

## 2021-01-14 DIAGNOSIS — M25561 Pain in right knee: Secondary | ICD-10-CM

## 2021-01-14 HISTORY — DX: Pain in right knee: M25.561

## 2021-01-19 DIAGNOSIS — R0981 Nasal congestion: Secondary | ICD-10-CM

## 2021-01-19 DIAGNOSIS — R058 Other specified cough: Secondary | ICD-10-CM | POA: Insufficient documentation

## 2021-01-19 DIAGNOSIS — L089 Local infection of the skin and subcutaneous tissue, unspecified: Secondary | ICD-10-CM

## 2021-01-19 HISTORY — DX: Nasal congestion: R09.81

## 2021-01-19 HISTORY — DX: Local infection of the skin and subcutaneous tissue, unspecified: L08.9

## 2021-02-03 ENCOUNTER — Other Ambulatory Visit: Payer: Self-pay | Admitting: Cardiology

## 2021-04-01 ENCOUNTER — Other Ambulatory Visit: Payer: Self-pay | Admitting: Cardiology

## 2021-05-21 ENCOUNTER — Other Ambulatory Visit: Payer: Self-pay | Admitting: Cardiology

## 2021-07-10 ENCOUNTER — Other Ambulatory Visit: Payer: Self-pay | Admitting: Cardiology

## 2021-08-25 ENCOUNTER — Other Ambulatory Visit: Payer: Self-pay | Admitting: Cardiology

## 2021-08-30 ENCOUNTER — Other Ambulatory Visit: Payer: Self-pay | Admitting: Cardiology

## 2021-09-11 ENCOUNTER — Encounter: Payer: Self-pay | Admitting: Cardiology

## 2021-09-11 ENCOUNTER — Ambulatory Visit: Payer: BLUE CROSS/BLUE SHIELD | Admitting: Cardiology

## 2021-09-11 VITALS — BP 124/80 | HR 69 | Ht <= 58 in | Wt 140.0 lb

## 2021-09-11 DIAGNOSIS — I2510 Atherosclerotic heart disease of native coronary artery without angina pectoris: Secondary | ICD-10-CM | POA: Diagnosis not present

## 2021-09-11 DIAGNOSIS — I119 Hypertensive heart disease without heart failure: Secondary | ICD-10-CM

## 2021-09-11 DIAGNOSIS — E7800 Pure hypercholesterolemia, unspecified: Secondary | ICD-10-CM | POA: Diagnosis not present

## 2021-09-11 NOTE — Progress Notes (Signed)
Cardiology Office Note:    Date:  09/11/2021   ID:  Melanie Johnson, DOB 1965-10-13, MRN [id number]  PCP:  Hadley Pen, MD  Cardiologist:  Norman Herrlich, MD    Referring MD: Hadley Pen, MD    ASSESSMENT:    1. CAD in native artery   2. Hypertensive heart disease, unspecified whether heart failure present   3. Pure hypercholesterolemia    PLAN:    In order of problems listed above:  Chonita continues to do well with CAD no recurrent angina New York Heart Association class I following her PCI and on current medical treatment she will continue her aspirin her beta-blocker and her high intensity statin.  At this time I would not advise an ischemia evaluation Well-controlled continue guideline directed therapy including beta-blocker Entresto and hydralazine along with loop diuretic Continue her high intensity statin LDL is within target and she has upcoming labs with her PCP   Next appointment: 1 year   Medication Adjustments/Labs and Tests Ordered: Current medicines are reviewed at length with the patient today.  Concerns regarding medicines are outlined above.  No orders of the defined types were placed in this encounter.  No orders of the defined types were placed in this encounter.   Chief Complaint  Patient presents with   Annual Exam   Follow-up   Coronary Artery Disease    History of Present Illness:    Melanie Johnson is a 56 y.o. female with a hx of CAD with anterior ST elevation MI PCI and stenting LAD 01/13/2018 initial ejection fraction severely reduced and subsequently normalizing 55 to 60%, hypertension and hyper lipidemia   last seen 08/29/2020.  Compliance with diet, lifestyle and medications: Yes  She is recovered fully from her trauma she feels well and has had no cardiovascular symptoms of edema shortness of breath chest pain palpitation or syncope She tolerates her statin without muscle pain or weakness Her last lipid profile was 1 year ago she  tells me she is planning for wellness exam with her PCP and will have her lipid profile rechecked 08/29/2020 cholesterol 124 LDL 50 hemoglobin 13.7 creatinine 0.59 potassium 3.9 Past Medical History:  Diagnosis Date   Acute ST elevation myocardial infarction (STEMI) of anterior wall (HCC) 01/10/2018   CAD in native artery 01/18/2018   Chronic combined systolic and diastolic heart failure (HCC) 01/26/2018   Hyperlipidemia 01/18/2018   Hypertension    Hypertensive heart disease 03/11/2018   Ischemic cardiomyopathy 01/18/2018   SOB (shortness of breath) 03/11/2018   ST elevation myocardial infarction involving left anterior descending (LAD) coronary artery (HCC) 01/11/2018   Thyroid disease     Past Surgical History:  Procedure Laterality Date   CORONARY/GRAFT ACUTE MI REVASCULARIZATION N/A 01/10/2018   Procedure: Coronary/Graft Acute MI Revascularization;  Surgeon: Tonny Bollman, MD;  [facility];  Service: Cardiovascular;  Laterality: N/A;   HUMERUS FRACTURE SURGERY     LEFT HEART CATH AND CORONARY ANGIOGRAPHY N/A 01/10/2018   Procedure: LEFT HEART CATH AND CORONARY ANGIOGRAPHY;  Surgeon: Tonny Bollman, MD;  Location: [facility] INVASIVE CV LAB;  Service: Cardiovascular;  Laterality: N/A;    Current Medications: Current Meds  Medication Sig   ascorbic acid (VITAMIN C) 500 MG tablet Take 1 tablet by mouth daily.   aspirin EC 81 MG tablet Take 81 mg by mouth daily.   atorvastatin (LIPITOR) 80 MG tablet Take 1 tablet (80 mg total) by mouth daily.   carvedilol (COREG) 12.5 MG tablet TAKE  1 TABLET BY MOUTH TWICE (2) DAILY WITH A MEAL   ENTRESTO 49-51 MG TAKE 1 TABLET BY MOUTH TWICE (2) DAILY   fluticasone (FLONASE) 50 MCG/ACT nasal spray Place 2 sprays into both nostrils daily as needed for allergies.   furosemide (LASIX) 20 MG tablet TAKE 1 TABLET BY MOUTH ONCE (1) A WEEK   glucosamine-chondroitin 500-400 MG tablet Take 1 tablet by mouth 2 (two) times daily.   hydrALAZINE  (APRESOLINE) 25 MG tablet TAKE ONE TABLET BY MOUTH 3 TIMES DAILY   levothyroxine (SYNTHROID) 125 MCG tablet Take 125 mcg by mouth daily.   nitroGLYCERIN (NITROSTAT) 0.4 MG SL tablet Place 0.4 mg under the tongue every 5 (five) minutes as needed for chest pain.   omeprazole (PRILOSEC) 20 MG capsule TAKE 1 CAPSULE BY MOUTH ONCE (1) DAILY   vitamin B-12 (CYANOCOBALAMIN) 500 MCG tablet Take 500 mcg by mouth daily.   Vitamin D, Ergocalciferol, (DRISDOL) 1.25 MG (50000 UNIT) CAPS capsule Take 50,000 Units by mouth once a week.   zinc gluconate 50 MG tablet Take 50 mg by mouth daily.     Allergies:   Penicillins   Social History   Socioeconomic History   Marital status: Married    Spouse name: Not on file   Number of children: Not on file   Years of education: Not on file   Highest education level: Not on file  Occupational History   Not on file  Tobacco Use   Smoking status: Former    Packs/day: 2.00    Years: 25.00    Total pack years: 50.00    Types: Cigarettes    Quit date: 01/11/2018    Years since quitting: 3.6   Smokeless tobacco: Never  Vaping Use   Vaping Use: Never used  Substance and Sexual Activity   Alcohol use: No   Drug use: No   Sexual activity: Not on file  Other Topics Concern   Not on file  Social History Narrative   Not on file   Social Determinants of Health   Financial Resource Strain: Low Risk  (01/11/2018)   Overall Financial Resource Strain (CARDIA)    Difficulty of Paying Living Expenses: Not hard at all  Food Insecurity: No Food Insecurity (01/11/2018)   Hunger Vital Sign    Worried About Running Out of Food in the Last Year: Never true    [place] in the Last Year: Never true  Transportation Needs: No Transportation Needs (01/11/2018)   PRAPARE - Hydrologist (Medical): No    Lack of Transportation (Non-Medical): No  Physical Activity: Insufficiently Active (01/11/2018)   Exercise Vital Sign    Days of  Exercise per Week: 2 days    Minutes of Exercise per Session: 20 min  Stress: No Stress Concern Present (01/11/2018)   [place]    Feeling of Stress : Not at all  Social Connections: [place] (01/11/2018)   Social Connection and Isolation Panel [NHANES]    Frequency of Communication with Friends and Family: More than three times a week    Frequency of Social Gatherings with Friends and Family: More than three times a week    Attends Religious Services: 1 to 4 times per year    Active Member of [employer] or Organizations: Yes    Attends Archivist Meetings: 1 to 4 times per year    Marital Status: Married  Family History: The patient's family history includes CAD in her father; Heart attack in her father; Heart disease in her father. ROS:   Please see the history of present illness.    All other systems reviewed and are negative.  EKGs/Labs/Other Studies Reviewed:    The following studies were reviewed today:  EKG:  EKG ordered today and personally reviewed.  The ekg ordered today demonstrates sinus rhythm old anterior septal MI    Physical Exam:    VS:  BP 124/80 (BP Location: Right Arm, Patient Position: Sitting, Cuff Size: Normal)   Pulse 69   Ht 4\' 10"  (1.473 m)   Wt 140 lb (63.5 kg)   SpO2 98%   BMI 29.26 kg/m     Wt Readings from Last 3 Encounters:  09/11/21 140 lb (63.5 kg)  08/29/20 138 lb 6.4 oz (62.8 kg)  01/29/20 136 lb (61.7 kg)     GEN:  Well nourished, well developed in no acute distress HEENT: Normal NECK: No JVD; No carotid bruits LYMPHATICS: No lymphadenopathy CARDIAC: RRR, no murmurs, rubs, gallops RESPIRATORY:  Clear to auscultation without rales, wheezing or rhonchi  ABDOMEN: Soft, non-tender, non-distended MUSCULOSKELETAL:  No edema; No deformity  SKIN: Warm and dry NEUROLOGIC:  Alert and oriented x 3 PSYCHIATRIC:  Normal affect    Signed, 01/31/20, MD  09/11/2021 1:19 PM    [facility]

## 2021-09-11 NOTE — Patient Instructions (Signed)

## 2021-09-15 ENCOUNTER — Other Ambulatory Visit: Payer: Self-pay | Admitting: Cardiology

## 2021-10-22 ENCOUNTER — Other Ambulatory Visit: Payer: Self-pay

## 2021-10-22 MED ORDER — CARVEDILOL 12.5 MG PO TABS: 12.5000 mg | ORAL_TABLET | Freq: Two times a day (BID) | ORAL | 3 refills | Status: DC

## 2021-10-22 NOTE — Telephone Encounter (Signed)
Carvedilol 12.5 mg # 180 x 3 refills sent to [facility]

## 2021-12-14 ENCOUNTER — Other Ambulatory Visit: Payer: Self-pay | Admitting: Cardiology

## 2021-12-15 NOTE — Telephone Encounter (Signed)
Furosemide refills to pharmacy

## 2021-12-22 ENCOUNTER — Other Ambulatory Visit: Payer: Self-pay

## 2021-12-22 MED ORDER — HYDRALAZINE HCL 25 MG PO TABS: ORAL_TABLET | ORAL | 1 refills | Status: DC

## 2022-03-04 ENCOUNTER — Other Ambulatory Visit: Payer: Self-pay

## 2022-03-04 MED ORDER — HYDRALAZINE HCL 25 MG PO TABS: ORAL_TABLET | ORAL | 3 refills | Status: DC

## 2022-03-04 NOTE — Telephone Encounter (Signed)
Refills sent to pharmacy as requested.

## 2022-05-11 ENCOUNTER — Other Ambulatory Visit: Payer: Self-pay

## 2022-05-11 MED ORDER — FUROSEMIDE 20 MG PO TABS: 20.0000 mg | ORAL_TABLET | ORAL | 1 refills | Status: DC

## 2022-05-19 ENCOUNTER — Other Ambulatory Visit: Payer: Self-pay

## 2022-05-20 MED ORDER — NITROGLYCERIN 0.4 MG SL SUBL: 0.4000 mg | SUBLINGUAL_TABLET | SUBLINGUAL | 3 refills | Status: DC | PRN

## 2022-06-21 DIAGNOSIS — D513 Other dietary vitamin B12 deficiency anemia: Secondary | ICD-10-CM

## 2022-06-21 HISTORY — DX: Other dietary vitamin B12 deficiency anemia: D51.3

## 2022-09-23 ENCOUNTER — Other Ambulatory Visit: Payer: Self-pay

## 2022-09-23 MED ORDER — ENTRESTO 49-51 MG PO TABS: 1.0000 | ORAL_TABLET | Freq: Two times a day (BID) | ORAL | 0 refills | Status: DC

## 2022-10-16 ENCOUNTER — Other Ambulatory Visit: Payer: Self-pay | Admitting: Cardiology

## 2022-11-04 ENCOUNTER — Other Ambulatory Visit: Payer: Self-pay | Admitting: Cardiology

## 2022-11-25 ENCOUNTER — Telehealth: Payer: Self-pay | Admitting: Cardiology

## 2022-11-25 ENCOUNTER — Other Ambulatory Visit: Payer: Self-pay

## 2022-11-25 MED ORDER — ENTRESTO 49-51 MG PO TABS: 1.0000 | ORAL_TABLET | Freq: Two times a day (BID) | ORAL | 0 refills | Status: DC

## 2022-11-25 NOTE — Telephone Encounter (Signed)
Spoke with patient and let her know she was due for a 1 year follow up with Dr. Dulce Sellar in July.  I got her scheduled to see him and sent her a refills of her medicine to get her through until her appointment.  She thanked me for the call and had no additional questions.

## 2022-11-25 NOTE — Telephone Encounter (Signed)
Pt c/o medication issue:  1. Name of Medication: sacubitril-valsartan (ENTRESTO) 49-51 MG   2. How are you currently taking this medication (dosage and times per day)? N/A  3. Are you having a reaction (difficulty breathing--STAT)? N/A  4. What is your medication issue? Is requesting refill be resent for 60 tablets to be a 30 day supply for the patient.

## 2022-12-16 ENCOUNTER — Other Ambulatory Visit: Payer: Self-pay | Admitting: Cardiology

## 2023-01-05 ENCOUNTER — Other Ambulatory Visit: Payer: Self-pay | Admitting: Cardiology

## 2023-01-12 ENCOUNTER — Other Ambulatory Visit: Payer: Self-pay
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
## 2023-01-13 ENCOUNTER — Ambulatory Visit: Payer: BLUE CROSS/BLUE SHIELD | Attending: Cardiology | Admitting: Cardiology

## 2023-01-13 ENCOUNTER — Encounter: Payer: Self-pay | Admitting: Cardiology

## 2023-01-13 VITALS — BP 146/88 | HR 71 | Ht 58.6 in | Wt 147.4 lb

## 2023-01-13 DIAGNOSIS — I119 Hypertensive heart disease without heart failure: Secondary | ICD-10-CM | POA: Diagnosis not present

## 2023-01-13 DIAGNOSIS — E7800 Pure hypercholesterolemia, unspecified: Secondary | ICD-10-CM | POA: Diagnosis not present

## 2023-01-13 DIAGNOSIS — I2510 Atherosclerotic heart disease of native coronary artery without angina pectoris: Secondary | ICD-10-CM | POA: Diagnosis not present

## 2023-01-13 NOTE — Patient Instructions (Addendum)
Medication Instructions:  Your physician recommends that you continue on your current medications as directed. Please refer to the Current Medication list given to you today.  *If you need a refill on your cardiac medications before your next appointment, please call your pharmacy*   Lab Work: None If you have labs (blood work) drawn today and your tests are completely normal, you will receive your results only by: MyChart Message (if you have MyChart) OR A paper copy in the mail If you have any lab test that is abnormal or we need to change your treatment, we will call you to review the results.   Testing/Procedures: Your physician has requested that you have an echocardiogram. Echocardiography is a painless test that uses sound waves to create images of your heart. It provides your doctor with information about the size and shape of your heart and how well your heart's chambers and valves are working. This procedure takes approximately one hour. There are no restrictions for this procedure. Please do NOT wear cologne, perfume, aftershave, or lotions (deodorant is allowed). Please arrive 15 minutes prior to your appointment time.  Please note: We ask at that you not bring children with you during ultrasound (echo/ vascular) testing. Due to room size and safety concerns, children are not allowed in the ultrasound rooms during exams. Our front office staff cannot provide observation of children in our lobby area while testing is being conducted. An adult accompanying a patient to their appointment will only be allowed in the ultrasound room at the discretion of the ultrasound technician under special circumstances. We apologize for any inconvenience.    Follow-Up: At [facility], you and your health needs are our priority.  As part of our continuing mission to provide you with exceptional heart care, we have created designated Provider Care Teams.  These Care Teams include your  primary Cardiologist (physician) and Advanced Practice Providers (APPs -  Physician Assistants and Nurse Practitioners) who all work together to provide you with the care you need, when you need it.  We recommend signing up for the patient portal called "MyChart".  Sign up information is provided on this After Visit Summary.  MyChart is used to connect with patients for Virtual Visits (Telemedicine).  Patients are able to view lab/test results, encounter notes, upcoming appointments, etc.  Non-urgent messages can be sent to your provider as well.   To learn more about what you can do with MyChart, go to [email or web address].    Your next appointment:   1 year(s)  Provider:   Norman Herrlich, MD    Other Instructions Check home blood pressure for 2 weeks and then send to Dr. Dulce Sellar via MyChart

## 2023-01-13 NOTE — Addendum Note (Signed)
Addended by: Roxanne Mins I on: 01/13/2023 09:10 AM   Modules accepted: Orders

## 2023-01-21 ENCOUNTER — Other Ambulatory Visit: Payer: Self-pay | Admitting: Cardiology

## 2023-01-29 ENCOUNTER — Other Ambulatory Visit: Payer: Self-pay | Admitting: Cardiology

## 2023-02-08 ENCOUNTER — Encounter: Payer: Self-pay | Admitting: Cardiology

## 2023-02-11 ENCOUNTER — Other Ambulatory Visit: Payer: Self-pay

## 2023-02-11 MED ORDER — CARVEDILOL 12.5 MG PO TABS: 12.5000 mg | ORAL_TABLET | Freq: Two times a day (BID) | ORAL | 2 refills | Status: DC

## 2023-02-17 ENCOUNTER — Other Ambulatory Visit: Payer: BLUE CROSS/BLUE SHIELD

## 2023-03-04 ENCOUNTER — Other Ambulatory Visit: Payer: Self-pay | Admitting: Cardiology

## 2023-03-19 ENCOUNTER — Telehealth: Payer: Self-pay | Admitting: Cardiology

## 2023-03-19 MED ORDER — SPIRONOLACTONE 25 MG PO TABS: 25.0000 mg | ORAL_TABLET | Freq: Every day | ORAL | 3 refills | Status: DC

## 2023-03-19 NOTE — Telephone Encounter (Signed)
Baldo Daub, MD  You54 minutes ago (12:56 PM)    No change refill  Called and spoke to [employer]  Informed pharmacy, per provider No change refill  Pharmacy has no further questions at this time

## 2023-03-19 NOTE — Telephone Encounter (Signed)
Pt c/o medication issue:  1. Name of Medication:   sacubitril-valsartan (ENTRESTO) 49-51 MG  spironolactone (ALDACTONE) 25 MG tablet   2. How are you currently taking this medication (dosage and times per day)?   3. Are you having a reaction (difficulty breathing--STAT)? No  4. What is your medication issue? Pharmacy states that above medications have an interaction. They would like to know if it is okay to fill both. Please advise

## 2023-03-19 NOTE — Telephone Encounter (Signed)
Called and spoke with patient about new medication and having a BMP done in 2 weeks after starting this medication.

## 2023-03-19 NOTE — Telephone Encounter (Signed)
FWD to pharmacy and ordering provider for input/advisement

## 2023-03-19 NOTE — Telephone Encounter (Signed)
Patient coming in under the impression she never received feedback on BP log dropped off.  I read Dr. Hulen Shouts note to her-   "Your numbers remain quite elevated I think he would benefit from taking a small dose of the diuretic and I will ask the office to send a prescription for for spironolactone 25 mg daily 2 weeks after starting it you will need a BMP done in the office and also drop off a list of your home blood pressures again that I suspect will be at [facility]. I saw the notations on the side of your blood pressure list."  Patient is agreeable although she did feel like the medication Buspirone was increasing her BP as well- she is no longer on this medication.   Please send in rx to [facility] orders- call patient at [phone]

## 2023-10-08 ENCOUNTER — Telehealth: Payer: Self-pay | Admitting: *Deleted

## 2023-10-08 MED ORDER — FUROSEMIDE 20 MG PO TABS: 20.0000 mg | ORAL_TABLET | ORAL | 0 refills | Status: DC

## 2023-10-08 NOTE — Telephone Encounter (Signed)
Eliquis sent to [facility]

## 2023-11-02 ENCOUNTER — Other Ambulatory Visit: Payer: Self-pay

## 2023-11-02 ENCOUNTER — Encounter: Payer: Self-pay | Admitting: Cardiology

## 2023-11-02 MED ORDER — SACUBITRIL-VALSARTAN 49-51 MG PO TABS: 1.0000 | ORAL_TABLET | Freq: Two times a day (BID) | ORAL | 1 refills | Status: DC

## 2023-11-02 MED ORDER — CARVEDILOL 12.5 MG PO TABS: 12.5000 mg | ORAL_TABLET | Freq: Two times a day (BID) | ORAL | 1 refills | Status: DC

## 2023-11-04 ENCOUNTER — Encounter: Payer: Self-pay | Admitting: Cardiology

## 2023-11-04 NOTE — Progress Notes (Signed)
 Per daughter's charge nurse cardiac floor [facility]. It is a financial hardship without insurance to come to my office. The daughter went to the home exam at her normal cardiovascular exam and vital signs that she will forward to my office. Her mother's had no cardiovascular symptoms. I am getting asked my office staff to have her come in and draw a CMP and lipid profile we will renew her Medicaid patients and reschedule her in 1 year.

## 2023-12-03 ENCOUNTER — Other Ambulatory Visit: Payer: Self-pay

## 2023-12-03 MED ORDER — ATORVASTATIN CALCIUM 80 MG PO TABS: 80.0000 mg | ORAL_TABLET | Freq: Every day | ORAL | 3 refills | Status: AC

## 2023-12-03 MED ORDER — SPIRONOLACTONE 25 MG PO TABS: 25.0000 mg | ORAL_TABLET | Freq: Every day | ORAL | 3 refills | Status: DC

## 2023-12-03 MED ORDER — HYDRALAZINE HCL 25 MG PO TABS: 25.0000 mg | ORAL_TABLET | Freq: Three times a day (TID) | ORAL | 2 refills | Status: AC

## 2023-12-03 MED ORDER — NITROGLYCERIN 0.4 MG SL SUBL: 0.4000 mg | SUBLINGUAL_TABLET | SUBLINGUAL | 3 refills | Status: AC | PRN

## 2023-12-03 MED ORDER — FUROSEMIDE 20 MG PO TABS: 20.0000 mg | ORAL_TABLET | ORAL | 3 refills | Status: AC

## 2023-12-03 MED ORDER — SACUBITRIL-VALSARTAN 49-51 MG PO TABS: 1.0000 | ORAL_TABLET | Freq: Two times a day (BID) | ORAL | 3 refills | Status: AC

## 2023-12-03 MED ORDER — CARVEDILOL 12.5 MG PO TABS: 12.5000 mg | ORAL_TABLET | Freq: Two times a day (BID) | ORAL | 3 refills | Status: AC

## 2023-12-13 ENCOUNTER — Other Ambulatory Visit: Payer: Self-pay | Admitting: Cardiology

## 2024-03-17 ENCOUNTER — Telehealth: Payer: Self-pay

## 2024-03-17 ENCOUNTER — Other Ambulatory Visit: Payer: Self-pay

## 2024-03-17 MED ORDER — SPIRONOLACTONE 25 MG PO TABS: 25.0000 mg | ORAL_TABLET | Freq: Every day | ORAL | 0 refills | Status: AC

## 2024-03-17 NOTE — Telephone Encounter (Signed)
 error
